# Patient Record
Sex: Male | Born: 1935 | Smoking: Never smoker
Health system: Southern US, Community
[De-identification: ages and names within clinical notes are randomized; demographics above are authoritative.]

## PROBLEM LIST (undated history)

## (undated) DIAGNOSIS — I10 Essential (primary) hypertension: Secondary | ICD-10-CM

## (undated) DIAGNOSIS — E119 Type 2 diabetes mellitus without complications: Secondary | ICD-10-CM

## (undated) DIAGNOSIS — F0390 Unspecified dementia without behavioral disturbance: Secondary | ICD-10-CM

## (undated) HISTORY — PX: AORTIC VALVE REPLACEMENT: SHX41

---

## 2004-08-10 ENCOUNTER — Encounter: Payer: Self-pay | Admitting: Family Medicine

## 2004-09-10 ENCOUNTER — Encounter: Payer: Self-pay | Admitting: Family Medicine

## 2004-10-10 ENCOUNTER — Encounter: Payer: Self-pay | Admitting: Family Medicine

## 2004-11-10 ENCOUNTER — Encounter: Payer: Self-pay | Admitting: Family Medicine

## 2004-12-11 ENCOUNTER — Encounter: Payer: Self-pay | Admitting: Family Medicine

## 2005-01-08 ENCOUNTER — Encounter: Payer: Self-pay | Admitting: Family Medicine

## 2005-02-08 ENCOUNTER — Encounter: Payer: Self-pay | Admitting: Family Medicine

## 2005-03-10 ENCOUNTER — Encounter: Payer: Self-pay | Admitting: Family Medicine

## 2005-04-10 ENCOUNTER — Encounter: Payer: Self-pay | Admitting: Family Medicine

## 2005-05-10 ENCOUNTER — Encounter: Payer: Self-pay | Admitting: Family Medicine

## 2005-06-10 ENCOUNTER — Encounter: Payer: Self-pay | Admitting: Family Medicine

## 2005-07-11 ENCOUNTER — Encounter: Payer: Self-pay | Admitting: Family Medicine

## 2005-08-10 ENCOUNTER — Encounter: Payer: Self-pay | Admitting: Family Medicine

## 2005-09-10 ENCOUNTER — Encounter: Payer: Self-pay | Admitting: Family Medicine

## 2005-10-10 ENCOUNTER — Encounter: Payer: Self-pay | Admitting: Family Medicine

## 2005-10-23 ENCOUNTER — Ambulatory Visit: Payer: Self-pay | Admitting: Family Medicine

## 2005-11-10 ENCOUNTER — Ambulatory Visit: Payer: Self-pay | Admitting: Family Medicine

## 2005-11-12 ENCOUNTER — Encounter: Payer: Self-pay | Admitting: Family Medicine

## 2005-12-11 ENCOUNTER — Encounter: Payer: Self-pay | Admitting: Family Medicine

## 2005-12-16 ENCOUNTER — Ambulatory Visit: Payer: Self-pay | Admitting: Family Medicine

## 2006-01-01 ENCOUNTER — Ambulatory Visit: Payer: Self-pay | Admitting: Ophthalmology

## 2006-01-06 ENCOUNTER — Ambulatory Visit: Payer: Self-pay | Admitting: Ophthalmology

## 2006-01-08 ENCOUNTER — Encounter: Payer: Self-pay | Admitting: Family Medicine

## 2006-01-08 ENCOUNTER — Ambulatory Visit: Payer: Self-pay | Admitting: Family Medicine

## 2006-02-08 ENCOUNTER — Encounter: Payer: Self-pay | Admitting: Family Medicine

## 2006-03-10 ENCOUNTER — Encounter: Payer: Self-pay | Admitting: Family Medicine

## 2006-04-10 ENCOUNTER — Encounter: Payer: Self-pay | Admitting: Family Medicine

## 2006-05-10 ENCOUNTER — Encounter: Payer: Self-pay | Admitting: Family Medicine

## 2006-06-10 ENCOUNTER — Encounter: Payer: Self-pay | Admitting: Family Medicine

## 2006-07-11 ENCOUNTER — Encounter: Payer: Self-pay | Admitting: Family Medicine

## 2006-08-10 ENCOUNTER — Encounter: Payer: Self-pay | Admitting: Family Medicine

## 2006-09-10 ENCOUNTER — Encounter: Payer: Self-pay | Admitting: Family Medicine

## 2006-10-10 ENCOUNTER — Encounter: Payer: Self-pay | Admitting: Family Medicine

## 2006-11-10 ENCOUNTER — Encounter: Payer: Self-pay | Admitting: Family Medicine

## 2006-12-11 ENCOUNTER — Encounter: Payer: Self-pay | Admitting: Family Medicine

## 2007-01-09 ENCOUNTER — Encounter: Payer: Self-pay | Admitting: Family Medicine

## 2007-02-09 ENCOUNTER — Encounter: Payer: Self-pay | Admitting: Family Medicine

## 2007-03-11 ENCOUNTER — Encounter: Payer: Self-pay | Admitting: Family Medicine

## 2007-04-11 ENCOUNTER — Encounter: Payer: Self-pay | Admitting: Family Medicine

## 2007-05-11 ENCOUNTER — Encounter: Payer: Self-pay | Admitting: Family Medicine

## 2009-06-19 ENCOUNTER — Emergency Department: Payer: Self-pay | Admitting: Emergency Medicine

## 2009-09-10 ENCOUNTER — Ambulatory Visit: Payer: Self-pay | Admitting: Family Medicine

## 2009-10-10 ENCOUNTER — Ambulatory Visit: Payer: Self-pay | Admitting: Family Medicine

## 2009-10-15 ENCOUNTER — Ambulatory Visit: Payer: Self-pay | Admitting: Family Medicine

## 2011-03-19 ENCOUNTER — Ambulatory Visit: Payer: Self-pay | Admitting: Cardiovascular Disease

## 2011-03-25 ENCOUNTER — Ambulatory Visit: Payer: Self-pay | Admitting: Internal Medicine

## 2011-12-12 ENCOUNTER — Ambulatory Visit: Payer: Self-pay | Admitting: Ophthalmology

## 2011-12-12 DIAGNOSIS — I10 Essential (primary) hypertension: Secondary | ICD-10-CM

## 2011-12-12 LAB — PROTIME-INR: INR: 3

## 2011-12-24 ENCOUNTER — Ambulatory Visit: Payer: Self-pay | Admitting: Ophthalmology

## 2012-08-16 ENCOUNTER — Ambulatory Visit: Payer: Self-pay | Admitting: Family Medicine

## 2013-04-08 ENCOUNTER — Ambulatory Visit: Payer: Self-pay | Admitting: Family Medicine

## 2014-01-12 ENCOUNTER — Ambulatory Visit: Payer: Self-pay | Admitting: Family Medicine

## 2014-02-08 ENCOUNTER — Ambulatory Visit: Payer: Self-pay | Admitting: Family Medicine

## 2014-02-17 ENCOUNTER — Inpatient Hospital Stay: Payer: Self-pay | Admitting: Family Medicine

## 2014-02-17 LAB — PROTIME-INR
INR: 2
Prothrombin Time: 22.3 secs — ABNORMAL HIGH (ref 11.5–14.7)

## 2014-02-17 LAB — COMPREHENSIVE METABOLIC PANEL
ALBUMIN: 3.7 g/dL (ref 3.4–5.0)
ALT: 29 U/L (ref 12–78)
Alkaline Phosphatase: 60 U/L
Anion Gap: 0 — ABNORMAL LOW (ref 7–16)
BUN: 19 mg/dL — ABNORMAL HIGH (ref 7–18)
Bilirubin,Total: 0.6 mg/dL (ref 0.2–1.0)
Calcium, Total: 8.7 mg/dL (ref 8.5–10.1)
Chloride: 108 mmol/L — ABNORMAL HIGH (ref 98–107)
Co2: 33 mmol/L — ABNORMAL HIGH (ref 21–32)
Creatinine: 0.92 mg/dL (ref 0.60–1.30)
EGFR (African American): >60
EGFR (Non-African Amer.): >60
Glucose: 101 mg/dL — ABNORMAL HIGH (ref 65–99)
OSMOLALITY: 284 (ref 275–301)
Potassium: 3.9 mmol/L (ref 3.5–5.1)
SGOT(AST): 29 U/L (ref 15–37)
Sodium: 141 mmol/L (ref 136–145)
Total Protein: 6.8 g/dL (ref 6.4–8.2)

## 2014-02-17 LAB — CBC
HCT: 48.1 % (ref 40.0–52.0)
HGB: 15.4 g/dL (ref 13.0–18.0)
MCH: 29.8 pg (ref 26.0–34.0)
MCHC: 32.1 g/dL (ref 32.0–36.0)
MCV: 93 fL (ref 80–100)
Platelet: 133 10*3/uL — ABNORMAL LOW (ref 150–440)
RBC: 5.17 10*6/uL (ref 4.40–5.90)
RDW: 13.4 % (ref 11.5–14.5)
WBC: 9.7 10*3/uL (ref 3.8–10.6)

## 2014-02-17 LAB — TSH: THYROID STIMULATING HORM: 1.64 u[IU]/mL

## 2014-02-17 LAB — MAGNESIUM: Magnesium: 1.6 mg/dL — ABNORMAL LOW

## 2014-02-17 LAB — TROPONIN I
Troponin-I: 0.03 ng/mL
Troponin-I: 0.02 ng/mL

## 2014-02-17 LAB — APTT: ACTIVATED PTT: 33.8 s (ref 23.6–35.9)

## 2014-02-18 LAB — CBC WITH DIFFERENTIAL/PLATELET
Basophil #: 0 10*3/uL (ref 0.0–0.1)
Basophil %: 0.3 %
Eosinophil #: 0.1 10*3/uL (ref 0.0–0.7)
Eosinophil %: 1.7 %
HCT: 41.4 % (ref 40.0–52.0)
HGB: 13.2 g/dL (ref 13.0–18.0)
Lymphocyte #: 1.7 10*3/uL (ref 1.0–3.6)
Lymphocyte %: 19.5 %
MCH: 29.4 pg (ref 26.0–34.0)
MCHC: 31.9 g/dL — ABNORMAL LOW (ref 32.0–36.0)
MCV: 92 fL (ref 80–100)
MONO ABS: 0.7 x10 3/mm (ref 0.2–1.0)
Monocyte %: 8.8 %
NEUTROS ABS: 6 10*3/uL (ref 1.4–6.5)
Neutrophil %: 69.7 %
PLATELETS: 107 10*3/uL — AB (ref 150–440)
RBC: 4.5 10*6/uL (ref 4.40–5.90)
RDW: 13.5 % (ref 11.5–14.5)
WBC: 8.5 10*3/uL (ref 3.8–10.6)

## 2014-02-18 LAB — BASIC METABOLIC PANEL
ANION GAP: 2 — AB (ref 7–16)
BUN: 17 mg/dL (ref 7–18)
CALCIUM: 8.1 mg/dL — AB (ref 8.5–10.1)
CHLORIDE: 105 mmol/L (ref 98–107)
CREATININE: 0.86 mg/dL (ref 0.60–1.30)
Co2: 33 mmol/L — ABNORMAL HIGH (ref 21–32)
EGFR (African American): >60
EGFR (Non-African Amer.): >60
Glucose: 93 mg/dL (ref 65–99)
OSMOLALITY: 281 (ref 275–301)
POTASSIUM: 4 mmol/L (ref 3.5–5.1)
Sodium: 140 mmol/L (ref 136–145)

## 2014-02-18 LAB — LIPID PANEL
CHOLESTEROL: 108 mg/dL (ref 0–200)
HDL Cholesterol: 35 mg/dL — ABNORMAL LOW (ref 40–60)
Ldl Cholesterol, Calc: 60 mg/dL (ref 0–100)
TRIGLYCERIDES: 64 mg/dL (ref 0–200)
VLDL Cholesterol, Calc: 13 mg/dL (ref 5–40)

## 2014-02-18 LAB — MAGNESIUM: MAGNESIUM: 1.9 mg/dL

## 2014-02-18 LAB — PROTIME-INR
INR: 2.4
PROTHROMBIN TIME: 25.8 s — AB (ref 11.5–14.7)

## 2014-02-18 LAB — TROPONIN I: TROPONIN-I: 0.03 ng/mL

## 2014-02-19 LAB — PROTIME-INR
INR: 2.1
Prothrombin Time: 23.1 secs — ABNORMAL HIGH (ref 11.5–14.7)

## 2014-02-19 LAB — BASIC METABOLIC PANEL
Anion Gap: 3 — ABNORMAL LOW (ref 7–16)
BUN: 14 mg/dL (ref 7–18)
CHLORIDE: 101 mmol/L (ref 98–107)
CO2: 31 mmol/L (ref 21–32)
Calcium, Total: 8.6 mg/dL (ref 8.5–10.1)
Creatinine: 0.82 mg/dL (ref 0.60–1.30)
EGFR (African American): >60
EGFR (Non-African Amer.): >60
Glucose: 120 mg/dL — ABNORMAL HIGH (ref 65–99)
Osmolality: 272 (ref 275–301)
POTASSIUM: 4.1 mmol/L (ref 3.5–5.1)
Sodium: 135 mmol/L — ABNORMAL LOW (ref 136–145)

## 2014-02-19 LAB — PLATELET COUNT: Platelet: 127 10*3/uL — ABNORMAL LOW (ref 150–440)

## 2014-03-10 ENCOUNTER — Ambulatory Visit: Payer: Self-pay | Admitting: Family Medicine

## 2014-04-26 DIAGNOSIS — I4891 Unspecified atrial fibrillation: Secondary | ICD-10-CM | POA: Insufficient documentation

## 2014-04-26 DIAGNOSIS — R270 Ataxia, unspecified: Secondary | ICD-10-CM | POA: Insufficient documentation

## 2014-04-26 DIAGNOSIS — E538 Deficiency of other specified B group vitamins: Secondary | ICD-10-CM | POA: Insufficient documentation

## 2014-04-26 DIAGNOSIS — R200 Anesthesia of skin: Secondary | ICD-10-CM | POA: Insufficient documentation

## 2014-07-27 DIAGNOSIS — E119 Type 2 diabetes mellitus without complications: Secondary | ICD-10-CM | POA: Insufficient documentation

## 2014-10-07 IMAGING — CR DG CHEST 2V
1 series · 2 of 2 positions shown · non-contrast
Comparison: none

REASON FOR EXAM: cough wheezing Atelectasis
COMMENTS:

PROCEDURE:     KDR - KDXR CHEST PA (OR AP) AND LAT  - April 08, 2013 [DATE]
RESULT:     Comparison: None.

[Series 1: pa · 0.17mm/px · 2 of 2 slices shown]
[im 1/2]
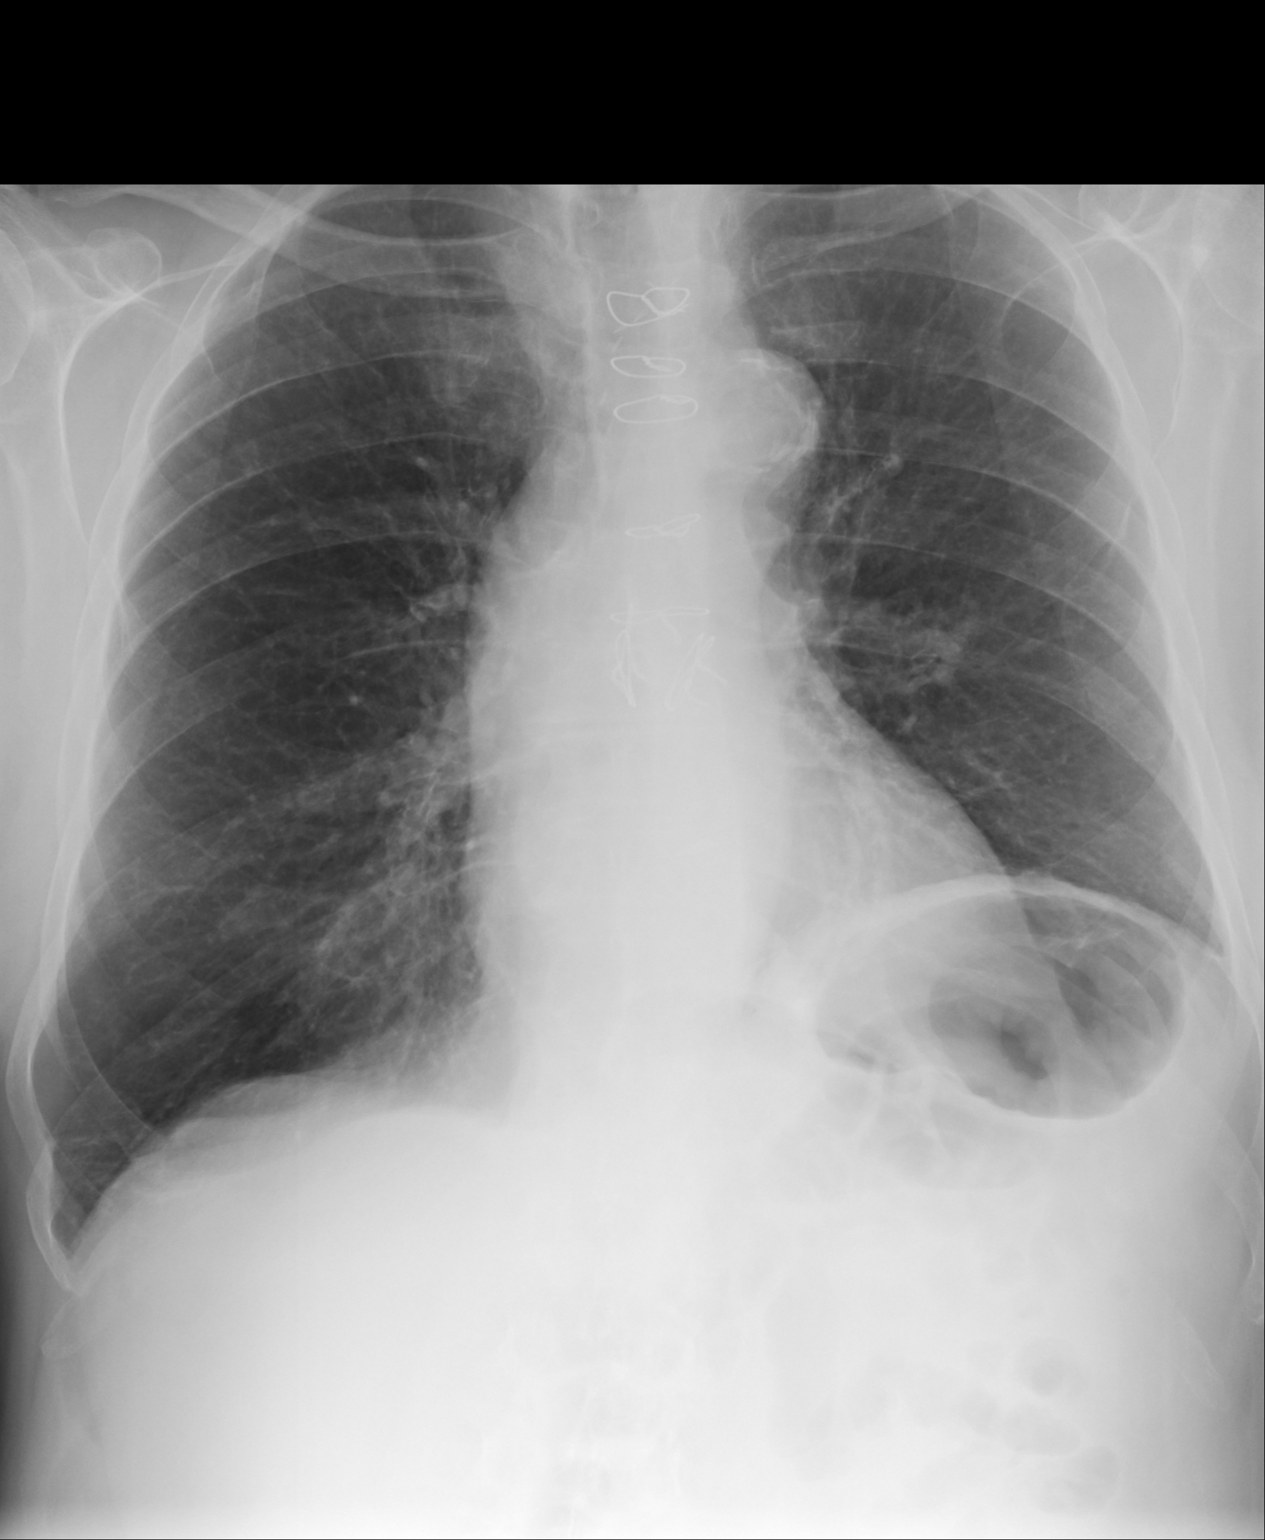
[im 2/2]
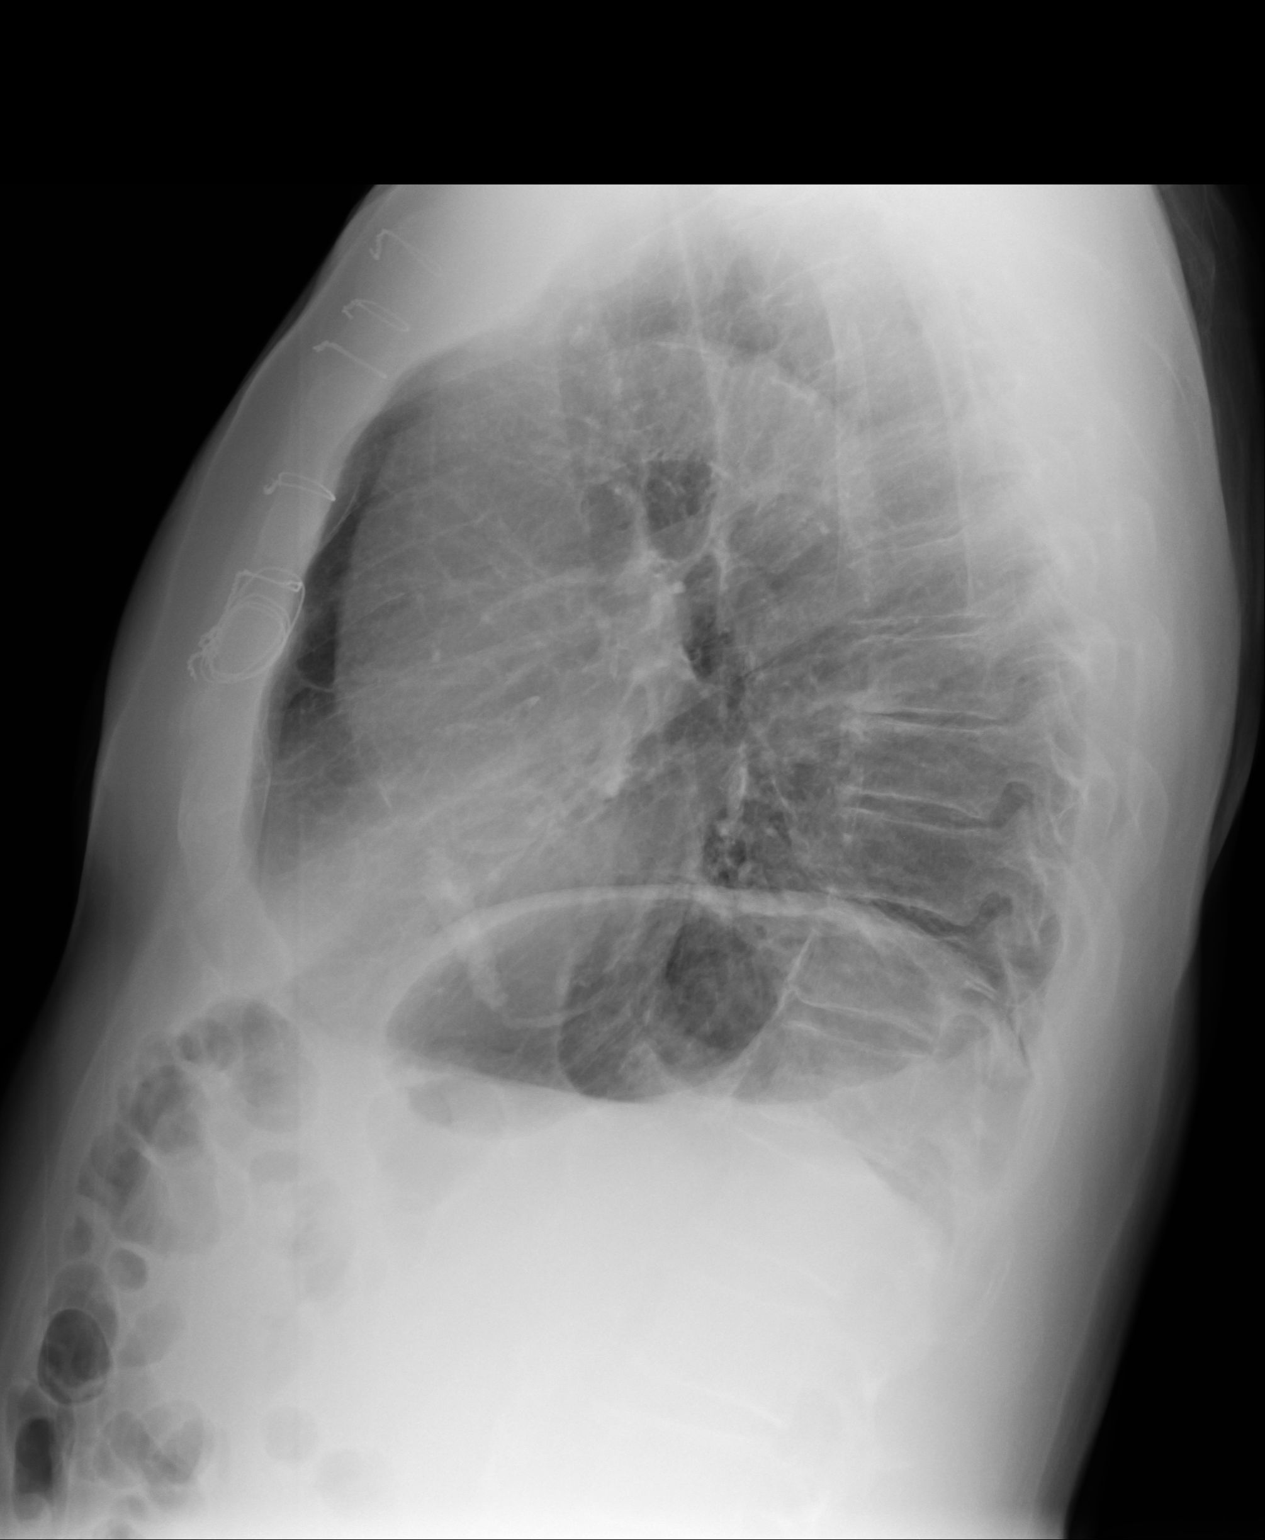

[2 of 2 positions shown; findings below may reference images not displayed]

FINDINGS: The heart is normal in size. Aorta is not tortuous. There is mild elevation
of the left hemidiaphragm. The right lung is somewhat hyperinflated.  Mild
left basilar opacities are likely secondary to atelectasis.
IMPRESSION: Mild left basilar opacities are likely secondary to atelectasis from the
left hemidiaphragm elevation. Followup PA and lateral chest radiograph is
suggested.

[REDACTED]

## 2015-03-03 NOTE — Consult Note (Signed)
PATIENT NAME:  Russell Fernandez, Russell Fernandez MR#:  [id number] DATE OF BIRTH:  [birth year]  DATE OF CONSULTATION:    REFERRING PHYSICIAN:  Dr. Renae GlossWieting CONSULTING PHYSICIAN:  Lamar BlinksBruce J. Kowalski, MD  REASON FOR CONSULTATION:  Aortic valve stenosis with atrial fibrillation with rapid ventricular rate and evidence of syncope.  The patient has had known aortic valve stenosis status post replacement many years ago and has had new onset of atrial fibrillation with rapid ventricular rate causing syncope.  At that time, the patient has had no evidence of significant chest discomfort, shortness of breath with or without physical activity.  The patient has had now seen in the Emergency Room with an EKG showing atrial fibrillation with rapid ventricular rate, but now controlled on diltiazem drip and much lower.  The patient has had no evidence of heart failure.    The remainder of review of systems negative for vision change, ringing in the ears, hearing loss, cough, congestion, heartburn, nausea, vomiting, diarrhea, bloody stools, stomach pain, extremity pain, leg weakness, cramping of the buttocks, known blood clots, headaches, dizzy spells or headaches, known blackouts as above.  No frequent urination, urination at night, muscle weakness, numbness, anxiety, depression, skin lesions, skin rashes.   PAST MEDICAL HISTORY: 1.  Aortic valve stenosis status post replacement.  2.  Hypertension.   FAMILY HISTORY:  No family members with early onset of cardiovascular disease or hypertension.   SOCIAL HISTORY:  Currently denies alcohol, tobacco use.   ALLERGIES:  AS LISTED.   MEDICATIONS:  As listed.   PHYSICAL EXAMINATION: VITAL SIGNS:  Blood pressure 110/68 bilaterally, heart rate 72 upright, reclining, and regular.  GENERAL:  He is a well-appearing male in no acute distress.  HEAD, EYES, EARS, NOSE, THROAT:  No icterus, thyromegaly, ulcers, hemorrhage, or xanthelasma.  CARDIOVASCULAR:  Irregularly irregular with normal S1 and  S2.  A 2 to 3 out of 6 right upper sternal border murmur nonradiating.  PMI is diffuse.  Carotid upstroke normal without bruit.  Jugular venous pressure is normal.  LUNGS:  Clear to auscultation with normal respirations.  ABDOMEN:  Soft, nontender, without hepatosplenomegaly or masses.  Abdominal aorta is normal in size without bruit.  EXTREMITIES:  2+ bilateral pulses in dorsal, pedal, radial and femoral arteries without lower extremity edema, cyanosis, clubbing or ulcers.  NEUROLOGIC:  He is oriented to time, place, and person, with normal mood and affect.   ASSESSMENT:  A 81 year old male with hypertension, hyperlipidemia and aortic valve replacement with atrial fibrillation and rapid ventricular rate causing syncope.   RECOMMENDATIONS: 1.  Continue serial echocardiogram and enzymes to assess for possible myocardial infarction.   2.  Echocardiogram for left ventricular dysfunction, change in valvular heart disease.  3.  Heart rate control with diltiazem drip and change to oral medication management.  4.  Anticoagulation for further risk reduction in stroke with atrial fibrillation.  5.  Further adjustments of medications after ambulation and possible further diagnostics depending on above.     ____________________________ Lamar BlinksBruce J. Kowalski, MD bjk:ea D: 02/17/2014 23:45:37 ET T: 02/18/2014 00:37:15 ET JOB#: [id number]  cc: Lamar BlinksBruce J. Kowalski, MD, <Dictator> Lamar BlinksBRUCE J KOWALSKI MD ELECTRONICALLY SIGNED 02/21/2014 7:50

## 2015-03-03 NOTE — Discharge Summary (Signed)
PATIENT NAME:  Russell Fernandez, Russell Fernandez MR#:  [id number] DATE OF BIRTH:  [birth year]  DATE OF ADMISSION:  02/17/2014 DATE OF DISCHARGE:  02/19/2014  REASON FOR ADMISSION: Rapid or fast heart rate.   DISCHARGE DIAGNOSES: 1. Atrial fibrillation with a rapid ventricular response.  2. Mechanical heart valve.  3. Chronic anticoagulation.  4. Hypertension.  5. Hyperlipidemia.  6. Type II controlled diabetes.  7. Hypokalemia.  8. Consumptive thrombocytopenia.  9. Increased CO2 with some mild possible respiratory alkalosis. Consider diagnosis of sleep apnea. Follow up outpatient.  10. Hypomagnesemia.  REPORTED RESULTS: Glucose 101, BUN 19, CO2 is slightly elevated at 33, magnesium was 1.6. LFTS were normal. Thyroid stimulating hormone 1.624, hemoglobin was 15.4. White count is 9.7, platelet count started at 133 dropped to 107 and back up to 127. INR at discharge 2.1.   Echocardiogram ejection fraction of 55% to 60%, normal ventricular systolic function, aortic valve normally functioning with mechanical prostatic valve [employer].   HOSPITAL COURSE: This is a very nice 81 year old gentleman with history of aortic valve replacement and anticoagulation, diabetes, hyperlipidemia, hypertension and some mild dementia. The patient comes on 02/17/2014 with a chief complaint of having rapid fast rate. The patient was taking his blood pressure with a new electronic monitor and noticed that his heart rate was in the 140s. He was mostly asymptomatic. He checked is pulse  several times and did notice heart rate was in the 140s, had been feeling tired lately and whenever he found out that his heart rate was high, started  to get nervous and really short of breath. The patient was seen in the Emergency Department where the wife actually told the ER  physician the evening before the patient had an episode where he passed out while sitting on the desk and rapidly recovered consciousness and did not want to go to the Emergency  Department. Department. The patient was given a Cardizem drip and evaluated for admission. At admission his heart rate was up to 139. He was sent to the stepdown unit and then he was downgraded to telemetry after he was able to get out of the Cardizem drip quickly. He was started on metoprolol 25 mg twice daily.   AS PER PROBLEMS:  1. Atrial flutter, atrial fibrillation with rapid ventricular response. The patient was seen by cardiology. Dr. Juliann Paresallwood is his primary cardiologist, Dr. Gwen PoundsKowalski was covering for him. The patient was continued on new beta blocker. The first day the patient was having great control with a heart rate in the 50s. He was feeling dizzy and lightheaded and weak for which the dose was changed to half of the dose 12.5 mg twice daily. By the following day, the patient was feeling much better and his heart rate was now in the 60s with a good blood pressure. No more symptoms of dizziness and lightheadedness.  2. As far as his mechanical valve replacement. The patient had Coumadin 5 mg and usually has been very stable for years. At this moment, we are going to continue him on the same dose and send him home. He had an extra dose of 2.5 mg the day of admission. At discharge, his INR is 2.1, but the patient feels comfortable taking his normal dose. This will be okay since he had a lot of trouble getting to a stable dose.  He has been told that to follow up with his Coumadin clinic in the next couple of days and if his INR is still below 2.5, which  is his goal due to his mechanical valve replacement to get an increase of the dose of Coumadin.  3. As far as his diabetes, was controlled with Glucophage.  4. Hyperlipidemia. The patient was asked to continue Lipitor.  5. Hypertension, acceptable control.   The patient had an echocardiogram that did not show any major abnormalities. The valve was working very good. The patient was given recommendation as far as avoiding strenuous physical activity,  avoiding a lot of caffeinated drinks and to go to the Emergency Department if the problem persists.   The patient was given at discharge, Coumadin 5 mg daily, enalapril 20 mg twice daily, atorvastatin 40 mg daily, fish oil 1 capsule once daily, vitamin B12 once daily, metformin 500 mg once daily, donepezil 5 mg once daily, metoprolol succinate 12.5 mg twice daily.   Follow-up with Dr. Juliann Pares and Dr. Dennison Mascot.   I spent about 40 minutes discharging this patient.   ____________________________ Felipa Furnace, MD rsg:sg D: 02/20/2014 07:19:03 ET T: 02/20/2014 08:20:35 ET JOB#: [id number]  cc: Felipa Furnace, MD, <Dictator> Dennison Mascot, MD Dwayne D. Juliann Pares, MD   Regan Rakers Juanda Chance MD ELECTRONICALLY SIGNED 02/26/2014 5:14

## 2015-03-03 NOTE — H&P (Signed)
PATIENT NAME:  Russell Fernandez, COTHERN MR#:  [id number] DATE OF BIRTH:  May 18, 1933  DATE OF ADMISSION:  02/17/2014  PRIMARY CARE PHYSICIAN: Dennison Mascot, MD  CARDIOLOGIST: Gerda Diss D. Callwood, MD  CHIEF COMPLAINT: Fast heart rate.   HISTORY OF PRESENT ILLNESS: This is a 81 year old man who has a history of mechanical valve replacement 17 years ago. He was routinely taking his blood pressure today, which was normal, but he saw his heart rate has been up in the 140s. He took his blood pressure numerous times, and the heart rate was up in the 140s each time. He states that he had some slight shortness of breath, he felt tired, and then he got scared and came in for further evaluation. He has been battling some bad allergies recently. His wife stated last evening that either he fell asleep or passed out quickly while he was at the keyboard. His head hit the keyboard, but then woke up right afterwards and then continued to do what he was doing on the computer. In the ER, initial EKG was rapid atrial flutter and then looks more like atrial fibrillation now on the monitor. The patient was placed on a Cardizem drip, and hospitalist services were contacted for further evaluation.   PAST MEDICAL HISTORY: Aortic valve replacement, allergies, diabetes, hyperlipidemia, hypertension, thumb arthritis, mild memory loss.   PAST SURGICAL HISTORY: Mechanical heart valve replacement (He thinks it is his aortic valve), cataract surgery.   ALLERGIES: No known drug allergies.   MEDICATIONS: Include atorvastatin 40 mg daily, Coumadin 5 mg daily, benazepril 5 mg at bedtime, enalapril 20 mg twice a day, fish oil 1200 mg daily, metformin 500 mg daily, vitamin B12 at 250 mcg daily.   SOCIAL HISTORY: No smoking. No alcohol. No drug use. 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 Lives with his wife.   FAMILY HISTORY: Father died at 54 of old age. He did have memory loss. Mother died in her 7s of  Alzheimer's. Brother died of heart disease. A sister brought died of bone cancer; she also had a heart valve replacement and heart disease.   REVIEW OF SYSTEMS:    CONSTITUTIONAL: No fever, chills or sweats. Positive for weight loss. Slight tiredness.  EARS, NOSE, MOUTH, AND THROAT: Positive for runny nose. Positive for postnasal drip. No sore throat. No difficulty swallowing.  EYES: No blurry vision.  CARDIOVASCULAR: No chest pain. No palpitations.  RESPIRATORY: Positive for slight shortness of breath, slight cough with the postnasal drip. No hemoptysis.  GASTROINTESTINAL: Positive for loose bowel movements. No nausea or vomiting. No abdominal pain. No bright red blood per rectum. No melena.  GENITOURINARY: No burning on urination. No hematuria.  MUSCULOSKELETAL: Positive for right thumb arthritis.  INTEGUMENTARY: No rashes or eruptions.  NEUROLOGIC: Questionable syncopal episode last night.  PSYCHIATRIC: No anxiety or depression.  ENDOCRINE: No thyroid problems.  HEMATOLOGIC AND LYMPHATIC: No anemia.   PHYSICAL EXAMINATION: VITAL SIGNS: On presentation to the Emergency Room included a temperature of 97.7, pulse 139, respirations 20, blood pressure 117/77, pulse oximetry 99% on room air.  GENERAL: No respiratory distress.  EYES: Conjunctivae and lids normal. Pupils equal, round, and reactive to light. Extraocular muscles intact. No nystagmus.  EARS, NOSE, MOUTH, AND THROAT: Tympanic membranes: No erythema. Nasal mucosa: No erythema. Throat: No erythema, no exudate seen. Lips and gums: No lesions.  NECK: No JVD. No bruits. No lymphadenopathy. No thyromegaly. No thyroid nodules palpated.  LUNGS: Clear to  auscultation. No use of accessory muscles to breathe. No rhonchi, rales, or wheeze heard.  CARDIOVASCULAR: S1, S2, irregularly, irregular, tachycardic; 2/6 systolic ejection murmur; no gallops or rubs heard. Carotid upstroke 2+ bilaterally. No bruits. Dorsalis pedis pulses 1+ bilaterally.  Trace edema of the lower extremity.  ABDOMEN: Soft, nontender. No organomegaly/splenomegaly. Normoactive bowel sounds. No masses felt.  LYMPHATIC: No lymph nodes in the neck.  MUSCULOSKELETAL: Trace edema. No clubbing. No cyanosis.  SKIN: No ulcers or lesions.  NEUROLOGIC: Cranial nerves II through XII grossly intact. Deep tendon reflexes 1+ bilateral lower extremities.  PSYCHIATRIC: The patient is oriented to person, place, and time.   LABORATORY AND DIAGNOSTIC DATA: Chest x-ray showed chronic elevation of left hemidiaphragm. Troponin negative. INR 2. White blood cell count 9.7, hemoglobin and hematocrit 15.4 and 48.1, platelet count of 133. Glucose 109, BUN 19, creatinine 0.92, sodium 141, potassium 3.9, chloride 108, CO2 of 33, calcium 8.7. Liver function tests: Normal range.   EKG: Atrial flutter, 2:1 block, 137 beats per minute.   ASSESSMENT AND PLAN: 1.  Atrial flutter, looks like atrial fibrillation now: The patient is on Cardizem drip. 10 mg per hour. Will be admitted to Critical Care Unit stepdown. In order to try to get the patient downgraded to telemetry, we will start, metoprolol 25 mg stat and 25 mg b.i.d. in order to taper off the Cardizem. Will get an echocardiogram. Case discussed with Dr. Gwen PoundsKowalski, covering for Dr. Juliann Paresallwood, to see the patient. I will check a TSH and a magnesium.  2.  Mechanical heart valve: INR 2. Goal a little bit higher. Will give an extra dose of Coumadin 2.5 mg stat and check a PT in the a.m.  3.  Diabetes: On Glucophage.  4.  Hyperlipidemia: On Lipitor.  5.  Hypertension: On enalapril.  6.  Mild dementia: On Aricept.   TIME SPENT ON ADMISSION: 55 minutes.   CODE STATUS: The patient is a full code.   Right now, the patient will be admitted to the CCU secondary to rapid heart rate.   ____________________________ Herschell Dimesichard J. Renae GlossWieting, MD rjw:jcm D: 02/17/2014 13:47:52 ET T: 02/17/2014 15:03:34 ET JOB#: [id number]  cc: Herschell Dimesichard J. Renae GlossWieting, MD,  <Dictator> Dennison MascotLemont Morrisey, MD Dwayne D. Juliann Paresallwood, MD  Salley ScarletICHARD J Khaled Herda MD ELECTRONICALLY SIGNED 02/19/2014 19:18

## 2015-03-04 NOTE — Op Note (Signed)
PATIENT NAME:  Russell Fernandez, Russell Fernandez MR#:  [id number] DATE OF BIRTH:  [birth year]  DATE OF PROCEDURE:  12/24/2011  PREOPERATIVE DIAGNOSIS:  Senile cataract left eye.  POSTOPERATIVE DIAGNOSIS:  Senile cataract left eye.  PROCEDURE:  Phacoemulsification with posterior chamber intraocular lens implantation of the left eye.  LENS:  ZCB00 18.0-diopter posterior chamber intraocular lens.  ULTRASOUND TIME:  11% of 1 minute, 3 seconds. CDE 7.3.  SURGEON:  Italyhad Eudora Guevarra, MD  ANESTHESIA:  Topical with tetracaine drops and 2% Xylocaine jelly.  COMPLICATIONS:  None.  DESCRIPTION OF PROCEDURE:  The patient was identified in the holding room and transported to the operating room and placed in the supine position under the operating microscope.  The left eye was identified as the operative eye and it was prepped and draped in the usual sterile ophthalmic fashion.  A 1 millimeter clear-corneal paracentesis was made at the 1:30 position.  The anterior chamber was filled with Viscoat viscoelastic.  A 2.4 millimeter keratome was used to make a near-clear corneal incision at the 10:30 position.  A curvilinear capsulorrhexis was made with a cystotome and capsulorrhexis forceps.  Balanced salt solution was used to hydrodissect and hydrodelineate the nucleus.  Phacoemulsification was then used in horizontal chopping fashion to remove the lens nucleus and epinucleus.  The remaining cortex was then removed using the irrigation and aspiration handpiece. Provisc was then placed into the capsular bag to distend it for lens placement.  An ZCB00 18.0-diopter lens was then injected into the capsular bag.  The remaining viscoelastic was aspirated.  Wounds were hydrated with balanced salt solution.  The anterior chamber was inflated to a physiologic pressure with balanced salt solution.  Miostat was placed into the anterior chamber to constrict the pupil.  No wound leaks were noted.  Topical Vigamox drops and Maxitrol ointment were  applied to the eye.  The patient was taken to the recovery room in stable condition without complications of anesthesia or surgery.  ____________________________ Deirdre Evenerhadwick R. Cybill Uriegas, MD crb:cms D: 12/24/2011 14:58:39 ET T: 12/24/2011 17:13:16 ET JOB#: [id number]  cc: Deirdre Evenerhadwick R. Collier Monica, MD, <Dictator> Lockie MolaHADWICK Emmaly Leech MD ELECTRONICALLY SIGNED 12/31/2011 13:08

## 2015-04-12 ENCOUNTER — Telehealth: Payer: Self-pay | Admitting: Emergency Medicine

## 2015-04-12 NOTE — Telephone Encounter (Signed)
Russell GarnetMelissa Kallen called stating Russell Fernandez is there with her in [place] and do not have any medication. Due to Russell ForestShirley being in hospital he is more confused and not sure what he is taking. [facility] on Cou[facility] and is completely out. Not sure of any other medications. Spoke to [name] and he oke'd for refills to be called in to pharmacy for Russell Fernandez. Scripts called in.

## 2015-04-18 ENCOUNTER — Telehealth: Payer: Self-pay | Admitting: Family Medicine

## 2015-04-18 NOTE — Telephone Encounter (Signed)
Called [facility] and notified her that [place] is in [place] and will be for an undetermined amount of time. Please keep referral open.

## 2015-04-18 NOTE — Telephone Encounter (Signed)
Please return Naalehuarrie call. She would like to discuss a home health order.

## 2015-04-26 ENCOUNTER — Ambulatory Visit (INDEPENDENT_AMBULATORY_CARE_PROVIDER_SITE_OTHER): Payer: Medicare Other | Admitting: Emergency Medicine

## 2015-04-26 DIAGNOSIS — I82409 Acute embolism and thrombosis of unspecified deep veins of unspecified lower extremity: Secondary | ICD-10-CM

## 2015-04-26 LAB — POCT INR: INR: 1.8

## 2015-05-03 ENCOUNTER — Ambulatory Visit (INDEPENDENT_AMBULATORY_CARE_PROVIDER_SITE_OTHER): Payer: Medicare Other | Admitting: Family Medicine

## 2015-05-03 DIAGNOSIS — Z952 Presence of prosthetic heart valve: Secondary | ICD-10-CM | POA: Insufficient documentation

## 2015-05-03 DIAGNOSIS — Z954 Presence of other heart-valve replacement: Secondary | ICD-10-CM

## 2015-05-03 LAB — POCT INR
INR: 1.4
PT: 17.3

## 2015-05-23 ENCOUNTER — Telehealth: Payer: Self-pay | Admitting: Family Medicine

## 2015-05-23 ENCOUNTER — Ambulatory Visit (INDEPENDENT_AMBULATORY_CARE_PROVIDER_SITE_OTHER): Payer: Medicare Other | Admitting: Family Medicine

## 2015-05-23 DIAGNOSIS — Z952 Presence of prosthetic heart valve: Secondary | ICD-10-CM

## 2015-05-23 DIAGNOSIS — Z954 Presence of other heart-valve replacement: Secondary | ICD-10-CM

## 2015-05-23 DIAGNOSIS — F0390 Unspecified dementia without behavioral disturbance: Secondary | ICD-10-CM

## 2015-05-23 LAB — POCT INR: INR: 1.5

## 2015-05-23 NOTE — Telephone Encounter (Signed)
[facility] states that Dr Malvin JohnsPotter began patient on Depakote 125mg  2x daily. Would like to inform you because this medication could affect the patient coumodin levels.

## 2015-05-24 ENCOUNTER — Telehealth: Payer: Self-pay | Admitting: Family Medicine

## 2015-05-24 NOTE — Telephone Encounter (Signed)
[facility] and stated she needs a call back about pt.

## 2015-05-24 NOTE — Patient Outreach (Signed)
[facility] Care Management  05/24/2015  Russell Fernandez 1933/09/06 [id number]   Consult from MD office, Wynonia Lawmanheryl Poteat, RN assigned to outreach.  Corrie MckusickLisa O. Sharlee BlewMoore, AABA [facility] Care Management [facility] CM Assistant Phone: [phone] Fax: [phone]

## 2015-05-24 NOTE — Telephone Encounter (Signed)
Called office and left message for Lowella Bandyikki to return my call,she was out seeing patients.

## 2015-05-24 NOTE — Progress Notes (Signed)
Name: Russell Fernandez   MRN: [id number]    DOB: 05-28-1933   Date:05/24/2015       Progress Note  Subjective  Chief Complaint  No chief complaint on file.   HPI No past medical history on file.  History  Substance Use Topics  . Smoking status: Not on file  . Smokeless tobacco: Not on file  . Alcohol Use: Not on file    No current outpatient prescriptions on file.  Not on File  ROS   Objective  There were no vitals filed for this visit.   Physical Exam    Assessment & Plan

## 2015-06-06 ENCOUNTER — Ambulatory Visit (INDEPENDENT_AMBULATORY_CARE_PROVIDER_SITE_OTHER): Payer: Medicare Other | Admitting: Family Medicine

## 2015-06-06 DIAGNOSIS — Z954 Presence of other heart-valve replacement: Secondary | ICD-10-CM | POA: Diagnosis not present

## 2015-06-06 DIAGNOSIS — Z952 Presence of prosthetic heart valve: Secondary | ICD-10-CM

## 2015-06-06 LAB — POCT INR: INR: 1.2

## 2015-06-06 MED ORDER — WARFARIN SODIUM 5 MG PO TABS: 5.0000 mg | ORAL_TABLET | Freq: Every day | ORAL | Status: DC

## 2015-06-13 ENCOUNTER — Ambulatory Visit (INDEPENDENT_AMBULATORY_CARE_PROVIDER_SITE_OTHER): Payer: Medicare Other

## 2015-06-13 DIAGNOSIS — Z7901 Long term (current) use of anticoagulants: Secondary | ICD-10-CM | POA: Diagnosis not present

## 2015-06-13 DIAGNOSIS — Z5181 Encounter for therapeutic drug level monitoring: Secondary | ICD-10-CM | POA: Diagnosis not present

## 2015-06-13 DIAGNOSIS — I38 Endocarditis, valve unspecified: Secondary | ICD-10-CM

## 2015-06-13 LAB — POCT INR
INR: 1.3
INR: 1.3

## 2015-06-13 NOTE — Patient Instructions (Signed)
Dr. Thana Ates would like patient to take 7.5 mg Monday-Wednesday, Friday and Sunday and the other days Tuesday, Thursday and Saturday to take 10 mg. Then to return in 2 weeks for a recheck.

## 2015-06-22 ENCOUNTER — Other Ambulatory Visit: Payer: Self-pay | Admitting: Pharmacist

## 2015-06-22 NOTE — Patient Outreach (Signed)
Russell Fernandez is a 81 y.o. referred to pharmacy for assistance with medication adherence/management. Patient verifies name and date of birth. Speak to [name] about our program. Patient states that he does not have any need for our service and declines participation in the program.   Provide patient with my contact information in case he has medication questions in the future. Will now close out pharmacy episode for this patient and send Care Management Assistant Nena Polio an [facility] message that patient has declined to participate.  Duanne Moron, PharmD Clinical Pharmacist [facility] Care Management [phone]

## 2015-06-22 NOTE — Patient Outreach (Signed)
[facility]) Care Management  06/22/2015  GIO JANOSKI 04/10/1933 [id number]   Notification received from Duanne Moron, PharmD to close case due to patient refusal of services.  Vivian Neuwirth L. Lipa Knauff, AAS [facility] Care Management Assistant

## 2015-06-27 ENCOUNTER — Ambulatory Visit (INDEPENDENT_AMBULATORY_CARE_PROVIDER_SITE_OTHER): Payer: Medicare Other | Admitting: Emergency Medicine

## 2015-06-27 DIAGNOSIS — Z5181 Encounter for therapeutic drug level monitoring: Secondary | ICD-10-CM | POA: Diagnosis not present

## 2015-06-27 DIAGNOSIS — I38 Endocarditis, valve unspecified: Secondary | ICD-10-CM | POA: Diagnosis not present

## 2015-06-27 LAB — POCT INR: INR: 1.3

## 2015-07-11 ENCOUNTER — Ambulatory Visit: Payer: Federal, State, Local not specified - PPO

## 2015-07-17 ENCOUNTER — Encounter: Payer: Self-pay | Admitting: Family Medicine

## 2015-07-17 ENCOUNTER — Ambulatory Visit (INDEPENDENT_AMBULATORY_CARE_PROVIDER_SITE_OTHER): Payer: Medicare Other | Admitting: Family Medicine

## 2015-07-17 DIAGNOSIS — Z952 Presence of prosthetic heart valve: Secondary | ICD-10-CM

## 2015-07-17 DIAGNOSIS — I481 Persistent atrial fibrillation: Secondary | ICD-10-CM

## 2015-07-17 DIAGNOSIS — I10 Essential (primary) hypertension: Secondary | ICD-10-CM | POA: Insufficient documentation

## 2015-07-17 DIAGNOSIS — Z7901 Long term (current) use of anticoagulants: Secondary | ICD-10-CM

## 2015-07-17 DIAGNOSIS — F0390 Unspecified dementia without behavioral disturbance: Secondary | ICD-10-CM | POA: Insufficient documentation

## 2015-07-17 DIAGNOSIS — Z954 Presence of other heart-valve replacement: Secondary | ICD-10-CM | POA: Diagnosis not present

## 2015-07-17 DIAGNOSIS — I4819 Other persistent atrial fibrillation: Secondary | ICD-10-CM

## 2015-07-17 DIAGNOSIS — I509 Heart failure, unspecified: Secondary | ICD-10-CM | POA: Insufficient documentation

## 2015-07-17 DIAGNOSIS — E7800 Pure hypercholesterolemia, unspecified: Secondary | ICD-10-CM | POA: Insufficient documentation

## 2015-07-17 DIAGNOSIS — R011 Cardiac murmur, unspecified: Secondary | ICD-10-CM | POA: Insufficient documentation

## 2015-07-17 LAB — POCT INR: INR: 1.1

## 2015-07-17 NOTE — Patient Instructions (Signed)
No change in therapy. Patient is confused and will not allow Homecare to come in for assistance

## 2015-07-27 ENCOUNTER — Ambulatory Visit: Payer: Federal, State, Local not specified - PPO

## 2015-08-18 IMAGING — CR DG CHEST 1V PORT
1 series · 1 of 1 positions shown · non-contrast
Comparison: DG CHEST 2V dated 04/08/2013

CLINICAL DATA: tachycardia, hx DM, hx heart valve replacement,
former smoker

EXAM:
PORTABLE CHEST - 1 VIEW

[ap]
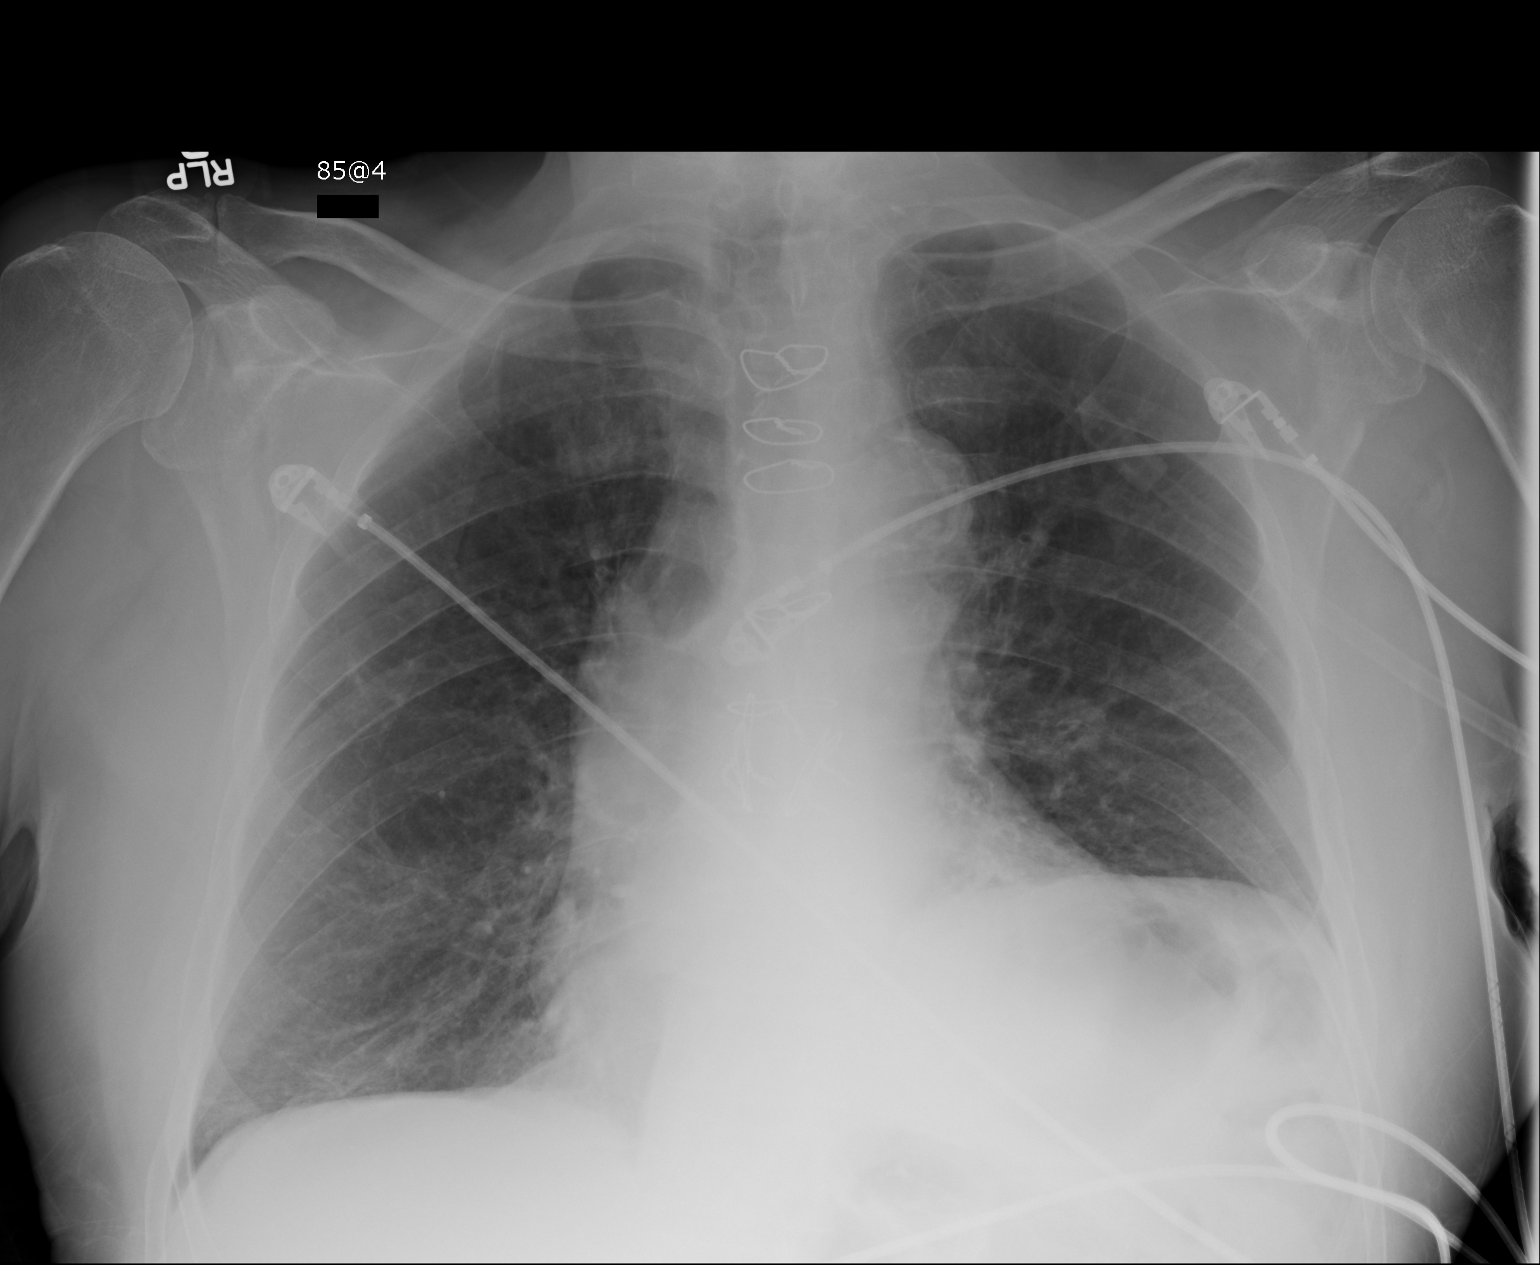

[1 of 1 positions shown; findings below may reference images not displayed]

FINDINGS: Chronic elevation of left hemidiaphragm. Cardiac silhouette
moderately enlarged. Patient is status post median sternotomy. The
aorta is tortuous. Atherosclerotic calcifications within arch of
aorta. Lungs are clear. Mild degenerative changes within the
shoulders.
IMPRESSION: Chronic elevation left hemidiaphragm no acute cardiopulmonary
disease.

## 2015-09-10 ENCOUNTER — Ambulatory Visit (INDEPENDENT_AMBULATORY_CARE_PROVIDER_SITE_OTHER): Payer: Medicare Other | Admitting: Family Medicine

## 2015-09-10 VITALS — BP 132/84 | HR 139 | Temp 97.7°F | Resp 16 | Ht 64.0 in | Wt 152.3 lb

## 2015-09-10 DIAGNOSIS — I481 Persistent atrial fibrillation: Secondary | ICD-10-CM | POA: Diagnosis not present

## 2015-09-10 DIAGNOSIS — F0390 Unspecified dementia without behavioral disturbance: Secondary | ICD-10-CM

## 2015-09-10 DIAGNOSIS — D692 Other nonthrombocytopenic purpura: Secondary | ICD-10-CM | POA: Diagnosis not present

## 2015-09-10 DIAGNOSIS — E538 Deficiency of other specified B group vitamins: Secondary | ICD-10-CM | POA: Diagnosis not present

## 2015-09-10 DIAGNOSIS — Z23 Encounter for immunization: Secondary | ICD-10-CM | POA: Diagnosis not present

## 2015-09-10 DIAGNOSIS — I4819 Other persistent atrial fibrillation: Secondary | ICD-10-CM

## 2015-09-10 LAB — POCT INR: INR: 1.2

## 2015-09-10 MED ORDER — DILTIAZEM HCL 30 MG PO TABS: 30.0000 mg | ORAL_TABLET | Freq: Three times a day (TID) | ORAL | Status: AC

## 2015-09-10 MED ORDER — CYANOCOBALAMIN 1000 MCG/ML IJ SOLN
1000.0000 ug | Freq: Once | INTRAMUSCULAR | Status: AC
  Administered 2015-09-10: 1000 ug via INTRAMUSCULAR

## 2015-09-10 NOTE — Progress Notes (Signed)
Name: Russell Fernandez   MRN: [id number]    DOB: February 18, 1933   Date:09/10/2015       Progress Note  Subjective  Chief Complaint  Chief Complaint  Patient presents with  . Atrial Fibrillation  . Dementia    HPI  Atrial Fibrillation/not rate controlled: history of aortic valve replacement. He has been given prescription of Coumadin, but not sure if taking. He has advanced dementia - wife died in May 15, 2023 - and lives by himself.  He was unable to give me the date, month or year. New that we were at [facility] in [facility]. He keeps saying that his wife is alive - initially said that his daughter was Byrd Hesselbach - but her name is Melissa.  Looks clean and has good eye contact. Seen by Neurologist , not sure if taking Aricept.    Patient Active Problem List   Diagnosis Date Noted  . CCF (congestive cardiac failure) (HCC) 07/17/2015  . Long term current use of anticoagulant 07/17/2015  . Dementia 07/17/2015  . Benign essential HTN 07/17/2015  . Hypercholesteremia 07/17/2015  . Cardiac murmur 07/17/2015  . H/O aortic valve replacement 07/17/2015  . Diabetes mellitus, type 2 (HCC) 07/27/2014  . 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 A-fib (HCC) 04/26/2014  . B12 deficiency 04/26/2014  . Absence of sensation 04/26/2014    History reviewed. No pertinent past surgical history.  History reviewed. No pertinent family history.  Social History   Social History  . Marital Status: Married    Spouse Name: N/A  . Number of Children: N/A  . Years of Education: N/A   Occupational History  . Not on file.   Social History Main Topics  . Smoking status: Never Smoker   . Smokeless tobacco: Never Used  . Alcohol Use: No  . Drug Use: No  . Sexual Activity: Not Currently   Other Topics Concern  . Not on file   Social History Narrative     Current outpatient prescriptions:  .  donepezil (ARICEPT) 10 MG tablet, Take 1 tablet by mouth., Disp: , Rfl:  .  diltiazem (CARDIZEM) 30 MG tablet, Take 1  tablet (30 mg total) by mouth 3 (three) times daily., Disp: 90 tablet, Rfl: 0  Allergies  Allergen Reactions  . Cetirizine Other (See Comments)    Causes patient to go in AFib.     ROS  Ten systems reviewed and is negative except as mentioned in HPI   Objective  Filed Vitals:   09/10/15 1527  BP: 132/84  Pulse: 139  Temp: 97.7 F (36.5 C)  TempSrc: Oral  Resp: 16  Height:  (1.626 m)  Weight: 152 lb 4.8 oz (69.083 kg)  SpO2: 96%    Body mass index is 26.13 kg/(m^2).  Physical Exam  Constitutional: Patient appears well-developed and thin.  No distress.  HEENT: head atraumatic, normocephalic, pupils equal and reactive to light, neck supple, throat within normal limits Cardiovascular:irregular rate and rhythm with high ventricular response.  No murmur heard. BLE edema. Pulmonary/Chest: Effort normal and breath sounds normal. No respiratory distress. Abdominal: Soft.  There is no tenderness. Psychiatric:Awake, alert disoriented in time, but not in place of self. Confused about his wife still being alive Skin: ecchymosis   Recent Results (from the past 2160 hour(s))  POCT INR     Status: None   Collection Time: 06/13/15  8:35 AM  Result Value Ref Range   INR 1.3   POCT INR     Status: Abnormal  Collection Time: 06/13/15  8:40 AM  Result Value Ref Range   INR 1.3   POCT INR     Status: None   Collection Time: 06/27/15  9:49 AM  Result Value Ref Range   INR 1.3   POCT INR     Status: None   Collection Time: 07/17/15 10:22 AM  Result Value Ref Range   INR 1.1   POCT INR     Status: None   Collection Time: 09/10/15  3:13 PM  Result Value Ref Range   INR 1.2       Assessment & Plan  1. Persistent atrial fibrillation (HCC)  Past INR's not therapeutic for months now, explained risk of stroke. He lives alone and has dementia. We will stop coumadin . Daughter is aware of the decision.  He had some palpitation recently, we will try Cardizem, discussed risk  of taking too many and also to monitor for hypotension, give first one when she is here with him - diltiazem (CARDIZEM) 30 MG tablet; Take 1 tablet (30 mg total) by mouth 3 (three) times daily.  Dispense: 90 tablet; Refill: 0   2. Dementia, without behavioral disturbance  Daughter , Efraim KaufmannMelissa live in [place], he has neighbors that take him out to eat.  She states that he has not been driving She did not check his pantry to see if he has any food in his house.  Discussed placement, either 24 hour home care, versus assistant living.  Discussed importance for her to get documentation to make decisions for him. Advised her to place his keys away  3. B12 deficiency  - cyanocobalamin ((VITAMIN B-12)) injection 1,000 mcg; Inject 1 mL (1,000 mcg total) into the muscle once.  4. Senile purpura (HCC)   5. Needs flu shot  - Flu vaccine HIGH DOSE PF (Fluzone High dose)

## 2015-09-25 ENCOUNTER — Telehealth: Payer: Self-pay | Admitting: Family Medicine

## 2015-09-25 NOTE — Telephone Encounter (Signed)
Pts daughter would like a call back when you can. She cancelled pts appt due to not being in town.

## 2015-10-01 NOTE — Telephone Encounter (Signed)
Pts daughter Efraim KaufmannMelissa called asking for a return call.

## 2015-10-02 ENCOUNTER — Ambulatory Visit: Payer: Federal, State, Local not specified - PPO | Admitting: Family Medicine

## 2015-10-02 NOTE — Telephone Encounter (Signed)
Called for appointment to discuss her fathers care. Need Guidance. Making appointment

## 2015-10-04 ENCOUNTER — Emergency Department: Payer: Medicare Other

## 2015-10-04 ENCOUNTER — Inpatient Hospital Stay
Admission: EM | Admit: 2015-10-04 | Discharge: 2015-10-11 | DRG: 308 | Disposition: E | Payer: Medicare Other | Attending: Internal Medicine | Admitting: Internal Medicine

## 2015-10-04 ENCOUNTER — Encounter: Payer: Self-pay | Admitting: Emergency Medicine

## 2015-10-04 DIAGNOSIS — I110 Hypertensive heart disease with heart failure: Secondary | ICD-10-CM | POA: Diagnosis present

## 2015-10-04 DIAGNOSIS — E119 Type 2 diabetes mellitus without complications: Secondary | ICD-10-CM | POA: Diagnosis present

## 2015-10-04 DIAGNOSIS — Y95 Nosocomial condition: Secondary | ICD-10-CM | POA: Diagnosis present

## 2015-10-04 DIAGNOSIS — R1310 Dysphagia, unspecified: Secondary | ICD-10-CM | POA: Diagnosis present

## 2015-10-04 DIAGNOSIS — Z66 Do not resuscitate: Secondary | ICD-10-CM | POA: Diagnosis present

## 2015-10-04 DIAGNOSIS — I472 Ventricular tachycardia: Secondary | ICD-10-CM | POA: Diagnosis present

## 2015-10-04 DIAGNOSIS — Z952 Presence of prosthetic heart valve: Secondary | ICD-10-CM

## 2015-10-04 DIAGNOSIS — I5022 Chronic systolic (congestive) heart failure: Secondary | ICD-10-CM | POA: Diagnosis present

## 2015-10-04 DIAGNOSIS — I482 Chronic atrial fibrillation: Secondary | ICD-10-CM | POA: Diagnosis present

## 2015-10-04 DIAGNOSIS — Z515 Encounter for palliative care: Secondary | ICD-10-CM | POA: Diagnosis not present

## 2015-10-04 DIAGNOSIS — J189 Pneumonia, unspecified organism: Secondary | ICD-10-CM | POA: Diagnosis present

## 2015-10-04 DIAGNOSIS — I950 Idiopathic hypotension: Secondary | ICD-10-CM | POA: Diagnosis present

## 2015-10-04 DIAGNOSIS — F0390 Unspecified dementia without behavioral disturbance: Secondary | ICD-10-CM | POA: Diagnosis present

## 2015-10-04 DIAGNOSIS — I255 Ischemic cardiomyopathy: Secondary | ICD-10-CM | POA: Diagnosis present

## 2015-10-04 DIAGNOSIS — R0602 Shortness of breath: Secondary | ICD-10-CM

## 2015-10-04 DIAGNOSIS — Z8249 Family history of ischemic heart disease and other diseases of the circulatory system: Secondary | ICD-10-CM

## 2015-10-04 DIAGNOSIS — E860 Dehydration: Secondary | ICD-10-CM | POA: Diagnosis present

## 2015-10-04 DIAGNOSIS — E782 Mixed hyperlipidemia: Secondary | ICD-10-CM | POA: Diagnosis present

## 2015-10-04 DIAGNOSIS — Z7901 Long term (current) use of anticoagulants: Secondary | ICD-10-CM | POA: Diagnosis not present

## 2015-10-04 DIAGNOSIS — I4891 Unspecified atrial fibrillation: Secondary | ICD-10-CM | POA: Diagnosis not present

## 2015-10-04 DIAGNOSIS — E440 Moderate protein-calorie malnutrition: Secondary | ICD-10-CM | POA: Diagnosis present

## 2015-10-04 DIAGNOSIS — J9691 Respiratory failure, unspecified with hypoxia: Secondary | ICD-10-CM | POA: Diagnosis present

## 2015-10-04 HISTORY — DX: Unspecified dementia, unspecified severity, without behavioral disturbance, psychotic disturbance, mood disturbance, and anxiety: F03.90

## 2015-10-04 HISTORY — DX: Essential (primary) hypertension: I10

## 2015-10-04 HISTORY — DX: Type 2 diabetes mellitus without complications: E11.9

## 2015-10-04 LAB — URINALYSIS COMPLETE WITH MICROSCOPIC (ARMC ONLY)
BILIRUBIN URINE: NEGATIVE
GLUCOSE, UA: NEGATIVE mg/dL
LEUKOCYTES UA: NEGATIVE
NITRITE: NEGATIVE
Protein, ur: 100 mg/dL — AB
Specific Gravity, Urine: 1.026 (ref 1.005–1.030)
pH: 5 (ref 5.0–8.0)

## 2015-10-04 LAB — CBC WITH DIFFERENTIAL/PLATELET
BASOS ABS: 0 10*3/uL (ref 0–0.1)
BASOS PCT: 0 %
EOS ABS: 0 10*3/uL (ref 0–0.7)
Eosinophils Relative: 0 %
HEMATOCRIT: 51.2 % (ref 40.0–52.0)
Hemoglobin: 16.1 g/dL (ref 13.0–18.0)
Lymphocytes Relative: 7 %
Lymphs Abs: 0.9 10*3/uL — ABNORMAL LOW (ref 1.0–3.6)
MCH: 28.5 pg (ref 26.0–34.0)
MCHC: 31.5 g/dL — ABNORMAL LOW (ref 32.0–36.0)
MCV: 90.4 fL (ref 80.0–100.0)
MONO ABS: 1.3 10*3/uL — AB (ref 0.2–1.0)
Monocytes Relative: 10 %
NEUTROS ABS: 10.7 10*3/uL — AB (ref 1.4–6.5)
Neutrophils Relative %: 83 %
PLATELETS: 138 10*3/uL — AB (ref 150–440)
RBC: 5.67 MIL/uL (ref 4.40–5.90)
RDW: 14.3 % (ref 11.5–14.5)
WBC: 13 10*3/uL — ABNORMAL HIGH (ref 3.8–10.6)

## 2015-10-04 LAB — PROTIME-INR
INR: 1.82
Prothrombin Time: 21 seconds — ABNORMAL HIGH (ref 11.4–15.0)

## 2015-10-04 LAB — TSH: TSH: 3.348 u[IU]/mL (ref 0.350–4.500)

## 2015-10-04 LAB — PHOSPHORUS: PHOSPHORUS: 4.9 mg/dL — AB (ref 2.5–4.6)

## 2015-10-04 LAB — COMPREHENSIVE METABOLIC PANEL
ALK PHOS: 48 U/L (ref 38–126)
ALT: 94 U/L — ABNORMAL HIGH (ref 17–63)
ANION GAP: 12 (ref 5–15)
AST: 133 U/L — ABNORMAL HIGH (ref 15–41)
Albumin: 3.7 g/dL (ref 3.5–5.0)
BUN: 47 mg/dL — ABNORMAL HIGH (ref 6–20)
CALCIUM: 9.4 mg/dL (ref 8.9–10.3)
CHLORIDE: 104 mmol/L (ref 101–111)
CO2: 25 mmol/L (ref 22–32)
Creatinine, Ser: 1.17 mg/dL (ref 0.61–1.24)
GFR calc non Af Amer: 57 mL/min — ABNORMAL LOW (ref >60)
Glucose, Bld: 154 mg/dL — ABNORMAL HIGH (ref 65–99)
Potassium: 4.8 mmol/L (ref 3.5–5.1)
SODIUM: 141 mmol/L (ref 135–145)
Total Bilirubin: 2.3 mg/dL — ABNORMAL HIGH (ref 0.3–1.2)
Total Protein: 6.2 g/dL — ABNORMAL LOW (ref 6.5–8.1)

## 2015-10-04 LAB — LIPASE, BLOOD: LIPASE: 36 U/L (ref 11–51)

## 2015-10-04 LAB — GLUCOSE, CAPILLARY: Glucose-Capillary: 140 mg/dL — ABNORMAL HIGH (ref 65–99)

## 2015-10-04 LAB — MAGNESIUM: MAGNESIUM: 2 mg/dL (ref 1.7–2.4)

## 2015-10-04 LAB — ETHANOL

## 2015-10-04 LAB — MRSA PCR SCREENING: MRSA by PCR: NEGATIVE

## 2015-10-04 LAB — TROPONIN I: TROPONIN I: 0.06 ng/mL — AB (ref <0.031)

## 2015-10-04 MED ORDER — DILTIAZEM HCL 25 MG/5ML IV SOLN
INTRAVENOUS | Status: AC
  Filled 2015-10-04: qty 5

## 2015-10-04 MED ORDER — WARFARIN SODIUM 1 MG PO TABS
5.0000 mg | ORAL_TABLET | Freq: Every day | ORAL | Status: DC
  Administered 2015-10-04: 5 mg via ORAL
  Filled 2015-10-04: qty 5
  Filled 2015-10-04: qty 1

## 2015-10-04 MED ORDER — SODIUM CHLORIDE 0.9 % IV BOLUS (SEPSIS)
500.0000 mL | Freq: Once | INTRAVENOUS | Status: AC
  Administered 2015-10-04: 500 mL via INTRAVENOUS

## 2015-10-04 MED ORDER — SODIUM CHLORIDE 0.9 % IV SOLN
INTRAVENOUS | Status: DC
  Administered 2015-10-04 – 2015-10-07 (×4): via INTRAVENOUS

## 2015-10-04 MED ORDER — DILTIAZEM HCL 30 MG PO TABS: 30.0000 mg | ORAL_TABLET | ORAL | Status: DC

## 2015-10-04 MED ORDER — CALCIUM GLUCONATE 10 % IV SOLN
1.0000 g | Freq: Once | INTRAVENOUS | Status: AC
  Administered 2015-10-04: 1 g via INTRAVENOUS

## 2015-10-04 MED ORDER — DEXTROSE 5 % IV SOLN
250.0000 mg | INTRAVENOUS | Status: DC
  Administered 2015-10-05: 250 mg via INTRAVENOUS
  Filled 2015-10-04 (×3): qty 250

## 2015-10-04 MED ORDER — MAGNESIUM SULFATE IN D5W 10-5 MG/ML-% IV SOLN
1.0000 g | Freq: Once | INTRAVENOUS | Status: AC
  Administered 2015-10-04: 1 g via INTRAVENOUS
  Filled 2015-10-04: qty 100

## 2015-10-04 MED ORDER — HEPARIN SODIUM (PORCINE) 5000 UNIT/ML IJ SOLN
5000.0000 [IU] | Freq: Three times a day (TID) | INTRAMUSCULAR | Status: DC
  Administered 2015-10-04 – 2015-10-05 (×3): 5000 [IU] via SUBCUTANEOUS
  Filled 2015-10-04 (×3): qty 1

## 2015-10-04 MED ORDER — DILTIAZEM HCL 25 MG/5ML IV SOLN
20.0000 mg | Freq: Once | INTRAVENOUS | Status: AC
  Administered 2015-10-04: 20 mg via INTRAVENOUS

## 2015-10-04 MED ORDER — SODIUM CHLORIDE 0.9 % IV SOLN: 1.0000 g | Freq: Once | INTRAVENOUS | Status: DC

## 2015-10-04 MED ORDER — SODIUM CHLORIDE 0.9 % IV BOLUS (SEPSIS): 500.0000 mL | Freq: Once | INTRAVENOUS | Status: DC

## 2015-10-04 MED ORDER — DILTIAZEM HCL 100 MG IV SOLR
5.0000 mg/h | INTRAVENOUS | Status: AC
  Administered 2015-10-04: 5 mg/h via INTRAVENOUS
  Filled 2015-10-04: qty 100

## 2015-10-04 MED ORDER — CALCIUM GLUCONATE 10 % IV SOLN
INTRAVENOUS | Status: AC
  Administered 2015-10-04: 1 g via INTRAVENOUS
  Filled 2015-10-04: qty 10

## 2015-10-04 MED ORDER — DILTIAZEM HCL 30 MG PO TABS
30.0000 mg | ORAL_TABLET | Freq: Three times a day (TID) | ORAL | Status: DC
  Administered 2015-10-04 – 2015-10-05 (×2): 30 mg via ORAL
  Filled 2015-10-04 (×2): qty 1

## 2015-10-04 MED ORDER — SODIUM CHLORIDE 0.9 % IV BOLUS (SEPSIS)
1000.0000 mL | Freq: Once | INTRAVENOUS | Status: AC
  Administered 2015-10-04: 1000 mL via INTRAVENOUS

## 2015-10-04 MED ORDER — DEXTROSE 5 % IV SOLN
500.0000 mg | Freq: Once | INTRAVENOUS | Status: AC
  Administered 2015-10-04: 500 mg via INTRAVENOUS
  Filled 2015-10-04: qty 500

## 2015-10-04 MED ORDER — HALOPERIDOL LACTATE 5 MG/ML IJ SOLN
5.0000 mg | Freq: Once | INTRAMUSCULAR | Status: AC
  Administered 2015-10-04: 5 mg via INTRAVENOUS
  Filled 2015-10-04: qty 1

## 2015-10-04 MED ORDER — DONEPEZIL HCL 5 MG PO TABS
10.0000 mg | ORAL_TABLET | Freq: Every day | ORAL | Status: DC
  Administered 2015-10-04 – 2015-10-08 (×2): 10 mg via ORAL
  Filled 2015-10-04 (×3): qty 2

## 2015-10-04 MED ORDER — HALOPERIDOL LACTATE 5 MG/ML IJ SOLN
2.5000 mg | Freq: Once | INTRAMUSCULAR | Status: AC
  Administered 2015-10-04: 2.5 mg via INTRAVENOUS
  Filled 2015-10-04: qty 1

## 2015-10-04 MED ORDER — INSULIN ASPART 100 UNIT/ML ~~LOC~~ SOLN
0.0000 [IU] | Freq: Three times a day (TID) | SUBCUTANEOUS | Status: DC
  Administered 2015-10-06 – 2015-10-09 (×4): 1 [IU] via SUBCUTANEOUS
  Administered 2015-10-09: 2 [IU] via SUBCUTANEOUS
  Administered 2015-10-09: 3 [IU] via SUBCUTANEOUS
  Filled 2015-10-04: qty 3
  Filled 2015-10-04: qty 2
  Filled 2015-10-04 (×4): qty 1

## 2015-10-04 MED ORDER — DEXTROSE 5 % IV SOLN
1.0000 g | Freq: Once | INTRAVENOUS | Status: AC
  Administered 2015-10-04: 1 g via INTRAVENOUS
  Filled 2015-10-04: qty 10

## 2015-10-04 MED ORDER — DEXTROSE 5 % IV SOLN
5.0000 mg/h | Freq: Once | INTRAVENOUS | Status: AC
  Administered 2015-10-04: 5 mg/h via INTRAVENOUS

## 2015-10-04 MED ORDER — DILTIAZEM HCL 25 MG/5ML IV SOLN
10.0000 mg | Freq: Once | INTRAVENOUS | Status: AC
  Administered 2015-10-04: 10 mg via INTRAVENOUS
  Filled 2015-10-04: qty 5

## 2015-10-04 MED ORDER — DEXTROSE 5 % IV SOLN
1.0000 g | INTRAVENOUS | Status: DC
  Administered 2015-10-05: 1 g via INTRAVENOUS
  Filled 2015-10-04 (×2): qty 10

## 2015-10-04 MED ORDER — DILTIAZEM HCL 100 MG IV SOLR
5.0000 mg/h | INTRAVENOUS | Status: DC
  Administered 2015-10-04 – 2015-10-05 (×2): 5 mg/h via INTRAVENOUS
  Filled 2015-10-04 (×2): qty 100

## 2015-10-04 MED ORDER — LORAZEPAM 2 MG/ML IJ SOLN
0.5000 mg | INTRAMUSCULAR | Status: DC | PRN
  Administered 2015-10-04 – 2015-10-07 (×7): 0.5 mg via INTRAVENOUS
  Filled 2015-10-04 (×7): qty 1

## 2015-10-04 NOTE — H&P (Signed)
[facility] [place] [facility]   PATIENT NAME: Russell Fernandez    MR#:  [id number]  DATE OF BIRTH:  1933/02/01  DATE OF ADMISSION:  09/16/2015  PRIMARY CARE PHYSICIAN: Dennison Mascot, MD   REQUESTING/REFERRING PHYSICIAN: Quale  CHIEF COMPLAINT:   Chief Complaint  Patient presents with  . Altered Mental Status    HISTORY OF PRESENT ILLNESS: Russell Fernandez  is a 81 y.o. male with a known history of dementia, diabetes, hypertension, aortic valve replacement with mechanical valve- lives alone at home his wife died in 2016/07/26and since then gradually he is getting worse and he is also under investigation by primary care and the urologist for his worsening dementia. His daughter who is power of attorney lives in [place] and has been insisting him to get to assisted living place or to rehabilitation but he denies for that he is able to leave independent life with help of daughter to help with financial things and neighbors taking him out if he wanted to go somewhere but otherwise at home he does not need any support to walk. Today history obtained from patient's daughter, she said that for last few days he has been getting progressively weak and seems not to be taking his medications. Had more confusion and so brought to emergency room. He was noted to have bilateral upper lobe pneumonia on chest x-ray and his heart rate was running fast with A. fib so given Cardizem injection multiple times to control it but it was not successful so finally started on Cardizem IV drip. Daughter confirms patient's wishes to be DO NOT RESUSCITATE and not getting on ventilator machine in any adverse situation. In ER ER physician tried to taper off the Cardizem drip after giving oral tablet for 3-4 hours, but not successful so finally given to hospitalist team for possible admission to stepdown unit.  PAST MEDICAL HISTORY:   Past Medical History  Diagnosis Date  . Dementia   . Diabetes  mellitus without complication (HCC)   . Hypertension     PAST SURGICAL HISTORY:  Past Surgical History  Procedure Laterality Date  . Aortic valve replacement      SOCIAL HISTORY:  Social History  Substance Use Topics  . Smoking status: Never Smoker   . Smokeless tobacco: Never Used  . Alcohol Use: No    FAMILY HISTORY:  Family History  Problem Relation Age of Onset  . Dementia Father   . CAD Brother     DRUG ALLERGIES:  Allergies  Allergen Reactions  . Cetirizine Other (See Comments)    Causes patient to go in AFib.    REVIEW OF SYSTEMS:   Because of dementia and change in mental status patient is not able to give me any history  MEDICATIONS AT HOME:  Prior to Admission medications   Medication Sig Start Date End Date Taking? Authorizing Provider  diltiazem (CARDIZEM) 30 MG tablet Take 1 tablet (30 mg total) by mouth 3 (three) times daily. 09/10/15   Alba Cory, MD  donepezil (ARICEPT) 10 MG tablet Take 1 tablet by mouth. 05/23/15   Historical Provider, MD      PHYSICAL EXAMINATION:   VITAL SIGNS: Blood pressure 108/72, pulse 108, temperature 97.5 F (36.4 C), temperature source Axillary, resp. rate 32, height  (1.753 m), weight 72 kg (158 lb 11.7 oz), SpO2 98 %.  GENERAL:  81 y.o.-year-old patient lying in the bed with no acute distress.  EYES: Pupils equal, round, reactive  to light . No scleral icterus. Extraocular muscles intact.  HEENT: Head atraumatic, normocephalic. Oropharynx and nasopharynx clear. Mucosa dry NECK:  Supple, no jugular venous distention. No thyroid enlargement, no tenderness.  LUNGS: Equal breath sounds bilaterally, no wheezing, coarse crepitation. No use of accessory muscles of respiration.  CARDIOVASCULAR: S1, S2 normal. No murmurs, rubs, or gallops.  ABDOMEN: Soft, nontender, nondistended. Bowel sounds present. No organomegaly or mass.  EXTREMITIES: Mild pedal edema, no cyanosis, or clubbing.  NEUROLOGIC:  Patient was  agitated initially moving all 4 limbs but not following commands, so given injection to calm him down and after that he is drowsy. PSYCHIATRIC: The patient is drowsy.  SKIN: No obvious rash, lesion, or ulcer.   LABORATORY PANEL:   CBC  Recent Labs Lab 09/26/2015 1140  WBC 13.0*  HGB 16.1  HCT 51.2  PLT 138*  MCV 90.4  MCH 28.5  MCHC 31.5*  RDW 14.3  LYMPHSABS 0.9*  MONOABS 1.3*  EOSABS 0.0  BASOSABS 0.0   ------------------------------------------------------------------------------------------------------------------  Chemistries   Recent Labs Lab 10/09/2015 1140  NA 141  K 4.8  CL 104  CO2 25  GLUCOSE 154*  BUN 47*  CREATININE 1.17  CALCIUM 9.4  MG 2.0  AST 133*  ALT 94*  ALKPHOS 48  BILITOT 2.3*   ------------------------------------------------------------------------------------------------------------------ estimated creatinine clearance is 51.2 mL/min (by C-G formula based on Cr of 1.17). ------------------------------------------------------------------------------------------------------------------  Recent Labs  09/23/2015 1140  TSH 3.348     Coagulation profile  Recent Labs Lab 09/16/2015 1140  INR 1.82   ------------------------------------------------------------------------------------------------------------------- No results for input(s): DDIMER in the last 72 hours. -------------------------------------------------------------------------------------------------------------------  Cardiac Enzymes  Recent Labs Lab 09/24/2015 1140  TROPONINI 0.06*   ------------------------------------------------------------------------------------------------------------------ Invalid input(s): POCBNP  ---------------------------------------------------------------------------------------------------------------  Urinalysis    Component Value Date/Time   COLORURINE AMBER* 09/30/2015 1140   APPEARANCEUR HAZY* 09/15/2015 1140   LABSPEC 1.026  09/19/2015 1140   PHURINE 5.0 09/19/2015 1140   GLUCOSEU NEGATIVE 09/21/2015 1140   HGBUR 1+* 09/11/2015 1140   BILIRUBINUR NEGATIVE 10/09/2015 1140   KETONESUR TRACE* 09/25/2015 1140   PROTEINUR 100* 09/22/2015 1140   NITRITE NEGATIVE 09/22/2015 1140   LEUKOCYTESUR NEGATIVE 10/02/2015 1140     RADIOLOGY: Ct Head Wo Contrast  10/01/2015  CLINICAL DATA:  Stroke EXAM: CT HEAD WITHOUT CONTRAST TECHNIQUE: Contiguous axial images were obtained from the base of the skull through the vertex without intravenous contrast. COMPARISON:  08/16/2012 FINDINGS: Global atrophy. There is no mass effect, midline shift, or acute hemorrhage. Mastoid air cells clear. Minimal mucous layering in the right maxillary sinus is not significantly changed. IMPRESSION: No acute intracranial pathology. Electronically Signed   By: Jolaine Click M.D.   On: 09/12/2015 12:02   Dg Chest Port 1 View  09/16/2015  CLINICAL DATA:  Tachycardia, altered mental status, dementia. EXAM: PORTABLE CHEST 1 VIEW COMPARISON:  Chest x-rays dated 02/17/2014 and 04/08/2013. FINDINGS: Mild cardiomegaly is unchanged. Overall cardiomediastinal silhouette is stable in size and configuration. Median sternotomy wires appear intact and stable in alignment. There are new airspace opacities within the upper lobes bilaterally, with perhaps mild central pulmonary vascular congestion. Again noted is chronic elevation of the left hemidiaphragm with probable overlying atelectasis. Right lung base is relatively clear. IMPRESSION: New airspace opacities within the upper lobes bilaterally. This may indicate congestive heart failure with asymmetric pulmonary edema. Multifocal pneumonia is certainly not excluded, especially if febrile. Interstitial fibrosis is less likely given the normal appearance of these areas on previous exam. Given the biapical distribution,  tuberculosis is also a consideration. Cardiomegaly, stable. Elevation of the left hemidiaphragm, stable.  Electronically Signed   By: Bary RichardStan  Maynard M.D.   On: 10/03/2015 11:54    IMPRESSION AND PLAN:  * Atrial fibrillation with RVR  Requiring IV Cardizem drip to rate control.  His already on Coumadin for his mechanical heart while, but INR is subtherapeutic so I'll call pharmacy to manage the dose.  TSH is checked and it appears normal.  Patient appears slightly dehydrated so that is the reason for A. fib in combination with pneumonia.  Continue oral Cardizem.  He does not recover soon we might need to call cardiology consult and further workup like echocardiogram, currently I would like to hold on that because daughter agrees patient is a DO NOT RESUSCITATE and does not want to pursue very aggressive workups.  * Pneumonia  We will give IV Rocephin and azithromycin  Iv fluids.  * Hypertension   Cont Oral cardizem.   Daughter told- she takes enalapril also- we need to confirm meds tomorrow.    Currently blood pressure is running on lower normal side so hold the medications.  * Mechanical aortic valve.   On Coumadin subtherapeutic, pharmacy to help bruising.  * Dementia  Continue Aricept.  All the records are reviewed and case discussed with ED provider. Management plans discussed with the patient, family and they are in agreement.  CODE STATUS: DO NOT RESUSCITATE Explained to daughter about the situation as patient is requiring very controlling IV medication he will need to be monitored in stepdown unit for now. She understands and in any adverse event she would not like him to go on ventilator, or to receive CPR or shock.   TOTAL TIME TAKING CARE OF THIS PATIENT: 50 critical care minutes.    Altamese DillingVACHHANI, Angelicia Lessner M.D on 09/26/2015   Between 7am to 6pm - Pager - [phone]  After 6pm go to [email or web address] - password [email or web address]  [facility] [place] [facility]  Office  [phone]  CC: Primary care physician; Dennison MascotLemont Morrisey, MD   Note: This dictation was prepared with  Dragon dictation along with smaller phrase technology. Any transcriptional errors that result from this process are unintentional.

## 2015-10-04 NOTE — ED Notes (Signed)
Cardizem in. HR at 107

## 2015-10-04 NOTE — ED Notes (Signed)
Pt from home by EMS for possible stroke like symptoms called in by family. EMS states family reported pt was decreased mental status, combative, and wouldn't open eyes. EMS reports pts heart rate was 170, they gave 10mg  of cardizem, HR down to 117. Pt lives at home alone. Pt has Hx of dementia, DM, Afib. Upon arrival to ED pt was HR 164. Last known well is unknown by family.

## 2015-10-04 NOTE — ED Notes (Addendum)
Pt unable to follow instructions, continues to pull at IV access and BP cuff.

## 2015-10-04 NOTE — ED Notes (Signed)
Pharmacy called for Cardizem drip.

## 2015-10-04 NOTE — ED Notes (Signed)
MD notified of BP changes.

## 2015-10-04 NOTE — Progress Notes (Signed)
eLink Physician-Brief Progress Note Patient Name: Russell HoltsHal F Riling DOB: 1933/11/04 MRN: [id number]   Date of Service  2014-12-04  HPI/Events of Note   chart reviewed, 81 year old male with the mechanical aortic valve, now with pneumonia with A. fib with AVR with possible pulmonary edema. -Patient's respiratory status appears to be stable. He is requiring from room air to 2 L nasal cannula.   eICU Interventions  -no acute interventions at this time.         Shane CrutchPradeep Aevah Stansbery 2014-12-04, 8:22 PM

## 2015-10-04 NOTE — Consult Note (Signed)
ANTICOAGULATION CONSULT NOTE - Initial Consult  Pharmacy Consult for Warfarin dosing  Indication: atrial fibrillation  Allergies  Allergen Reactions  . Cetirizine Other (See Comments)    Causes patient to go in AFib.    Patient Measurements: Height: 5\' 9"  (175.3 cm) Weight: 158 lb 11.7 oz (72 kg) IBW/kg (Calculated) : 70.7  Vital Signs: Temp: 97.5 F (36.4 C) (11/24 1400) Temp Source: Axillary (11/24 1400) BP: 108/72 mmHg (11/24 1731) Pulse Rate: 108 (11/24 1733)  Labs:  Recent Labs  11-24-14 1140  HGB 16.1  HCT 51.2  PLT 138*  LABPROT 21.0*  INR 1.82  CREATININE 1.17  TROPONINI 0.06*    Estimated Creatinine Clearance: 51.2 mL/min (by C-G formula based on Cr of 1.17).   Medical History: Past Medical History  Diagnosis Date  . Dementia   . Diabetes mellitus without complication (HCC)   . Hypertension     Medications:  Scheduled:  . diltiazem  30 mg Oral STAT  . warfarin  5 mg Oral q1800   Infusions:  . azithromycin    . cefTRIAXone (ROCEPHIN)  IV      Assessment: 81 yo male ordered Warfarin for A-Fib with RVR   Goal of Therapy:  INR 2-3   Plan:  Ordered Warfarin 5mg  to begin tonight.  PT/INR ordered for tomorrow morning. Will adjust dose as per consult.    Stormy CardKatsoudas,Bane Hagy K, RPh Clinical Pharmacist 2015-03-03,5:51 PM

## 2015-10-04 NOTE — ED Provider Notes (Addendum)
[facility] Emergency Department Provider Note  ____________________________________________  Time seen: 11:35 AM on arrival by EMS  I have reviewed the triage vital signs and the nursing notes.   HISTORY  Chief Complaint Altered Mental Status  History Limited by acute critical illness and chronic dementia and altered mental status.  HPI Russell Fernandez is a 81 y.o. male who lives at home alone, has dementia and manages his own medications. When his family went to visit him this morning, they found that he was confused more than usual. They noted to EMS that he does not appear to have been taking his medications as prescribed including long-acting Cardizem.     Past Medical History  Diagnosis Date  . Dementia   . Diabetes mellitus without complication [facility])      Patient Active Problem List   Diagnosis Date Noted  . Senile purpura (HCC) 09/10/2015  . CCF (congestive cardiac failure) (HCC) 07/17/2015  . Long term current use of anticoagulant 07/17/2015  . Dementia 07/17/2015  . Benign essential HTN 07/17/2015  . Hypercholesteremia 07/17/2015  . Cardiac murmur 07/17/2015  . H/O aortic valve replacement 07/17/2015  . Diabetes mellitus, type 2 (HCC) 07/27/2014  . 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 A-fib (HCC) 04/26/2014  . B12 deficiency 04/26/2014  . Absence of sensation 04/26/2014     History reviewed. No pertinent past surgical history.   Current Outpatient Rx  Name  Route  Sig  Dispense  Refill  . diltiazem (CARDIZEM) 30 MG tablet   Oral   Take 1 tablet (30 mg total) by mouth 3 (three) times daily.   90 tablet   0   . donepezil (ARICEPT) 10 MG tablet   Oral   Take 1 tablet by mouth.            Allergies Cetirizine   History reviewed. No pertinent family history.  Social History Social History  Substance Use Topics  . Smoking status: Never Smoker   . Smokeless tobacco: Never Used  . Alcohol Use: No    Review of  Systems Unable to obtain due to critical illness and acute altered mental status  ____________________________________________   PHYSICAL EXAM:  VITAL SIGNS: ED Triage Vitals  Enc Vitals Group     BP 10/09/2015 1130 127/99 mmHg     Pulse Rate 10/03/2015 1135 133     Resp 09/21/2015 1130 34     Temp 10/03/2015 1135 98 F (36.7 C)     Temp Source 09/21/2015 1135 Oral     SpO2 10/02/2015 1127 94 %     Weight 10/07/2015 1135 158 lb 11.7 oz (72 kg)     Height 10/02/2015 1135  (1.753 m)     Head Cir --      Peak Flow --      Pain Score --      Pain Loc --      Pain Edu? --      Excl. in GC? --      Constitutional:   Awake, confused. Ill-appearing Eyes:   No scleral icterus. No conjunctival pallor. PERRL. EOMI ENT   Head:   Normocephalic and atraumatic.   Nose:   No congestion/rhinnorhea. No septal hematoma   Mouth/Throat:   Dry mucous membranes, no pharyngeal erythema. No peritonsillar mass. No uvula shift.   Neck:   No stridor. No SubQ emphysema. No meningismus. Hematological/Lymphatic/Immunilogical:   No cervical lymphadenopathy. Cardiovascular:   Irregularly irregular rhythm, heart rate 140-160. Normal and symmetric  distal pulses are present in all extremities. No murmurs, rubs, or gallops. Respiratory:   Rhonchi left lung, good air entry diffusely Gastrointestinal:   Soft and nontender. No distention. There is no CVA tenderness.  No rebound, rigidity, or guarding. Genitourinary:   deferred Musculoskeletal:   Nontender with normal range of motion in all extremities. No joint effusions.  No lower extremity tenderness.  No edema. Neurologic:   Normal speech , confused and coherent language.  CN 2-10 normal. Motor grossly intact. GCS = E3V4M6 = 13 Skin:    Skin is warm, dry and intact. No rash noted.  No petechiae, purpura, or bullae. Psychiatric:   Mood and affect are normal. Confused, limited insight  ____________________________________________    LABS (pertinent  positives/negatives) (all labs ordered are listed, but only abnormal results are displayed) Labs Reviewed  CBC WITH DIFFERENTIAL/PLATELET - Abnormal; Notable for the following:    WBC 13.0 (*)    MCHC 31.5 (*)    Platelets 138 (*)    Neutro Abs 10.7 (*)    Lymphs Abs 0.9 (*)    Monocytes Absolute 1.3 (*)    All other components within normal limits  URINALYSIS COMPLETEWITH MICROSCOPIC ([facility] ONLY) - Abnormal; Notable for the following:    Color, Urine AMBER (*)    APPearance HAZY (*)    Ketones, ur TRACE (*)    Hgb urine dipstick 1+ (*)    Protein, ur 100 (*)    Bacteria, UA RARE (*)    Squamous Epithelial / LPF 0-5 (*)    All other components within normal limits  PROTIME-INR - Abnormal; Notable for the following:    Prothrombin Time 21.0 (*)    All other components within normal limits  COMPREHENSIVE METABOLIC PANEL  ETHANOL  LIPASE, BLOOD  TROPONIN I   ____________________________________________   EKG  Interpreted by me Atrial fibrillation with rapid ventricular response, normal axis, normal intervals, poor R-wave progression in anterior precordial leads, normal ST segments. There are T-wave inversions in V6  ____________________________________________    RADIOLOGY  CT head unremarkable Chest x-ray reveals bilateral upper lung infiltrates consistent with pneumonia  ____________________________________________   PROCEDURES CRITICAL CARE Performed by: Scotty CourtSTAFFORD, Arda Keadle   Total critical care time: 35 minutes  Critical care time was exclusive of separately billable procedures and treating other patients.  Critical care was necessary to treat or prevent imminent or life-threatening deterioration.  Critical care was time spent personally by me on the following activities: development of treatment plan with patient and/or surrogate as well as nursing, discussions with consultants, evaluation of patient's response to treatment, examination of patient, obtaining  history from patient or surrogate, ordering and performing treatments and interventions, ordering and review of laboratory studies, ordering and review of radiographic studies, pulse oximetry and re-evaluation of patient's condition.   ____________________________________________   INITIAL IMPRESSION / ASSESSMENT AND PLAN / ED COURSE  Pertinent labs & imaging results that were available during my care of the patient were reviewed by me and considered in my medical decision making (see chart for details).  Patient seen immediately on arrival. EMS gave 10 mg of IV Cardizem for the tachycardia with only temporary improvement of his heart rate. We then gave 20 mg of IV Cardizem without improvement. Blood pressure remained stable. We will start a Cardizem drip. Likely his A. fib is out-of-control due to Cardizem noncompliance. In addition, he is found to have bilateral pneumonia. Due to the underlying A. fib, we will avoid Levaquin since this may worsen  his rate control, and will instead use ceftriaxone and azithromycin. We'll plan to hospitalize the patient for further management.  ----------------------------------------- 1:19 PM on 24-Oct-2015 -----------------------------------------  Patient also somewhat agitated, likely combination of delirium and exacerbation of his chronic dementia due to acute illness. We'll give him a low-dose Haldol as a safety measure both for his comfort but also to protect from pulling his IV. ----------------------------------------- 3:12 PM on 10/24/2015 -----------------------------------------  Patient continues to improve with diltiazem and magnesium and IV fluids. Rate is now 110-120. Blood pressure stable. Given IV calcium gluconate as well. Plan to wean from diltiazem drip. Case is been discussed with the hospitalist who will follow along as well for admission planning, likely to telemetry floor. Patient ordered repeat dose of Haldol for delirium. Further ED  care by Dr. Fanny Bien and hospitalist Dr. Madelon Lips.  ____________________________________________   FINAL CLINICAL IMPRESSION(S) / ED DIAGNOSES  Final diagnoses:  Atrial fibrillation with rapid ventricular response (HCC)  Bilateral pneumonia      Sharman Cheek, MD 10/24/2015 1222  Sharman Cheek, MD October 24, 2015 1320  Sharman Cheek, MD 24-Oct-2015 1513

## 2015-10-05 ENCOUNTER — Inpatient Hospital Stay
Admit: 2015-10-05 | Discharge: 2015-10-05 | Disposition: A | Discharge status: Home / Self Care | Payer: Medicare Other | Attending: Internal Medicine | Admitting: Internal Medicine

## 2015-10-05 LAB — CBC
HCT: 44.6 % (ref 40.0–52.0)
Hemoglobin: 14.2 g/dL (ref 13.0–18.0)
MCH: 28.6 pg (ref 26.0–34.0)
MCHC: 31.9 g/dL — ABNORMAL LOW (ref 32.0–36.0)
MCV: 89.8 fL (ref 80.0–100.0)
PLATELETS: 98 10*3/uL — AB (ref 150–440)
RBC: 4.97 MIL/uL (ref 4.40–5.90)
RDW: 14.2 % (ref 11.5–14.5)
WBC: 11.6 10*3/uL — ABNORMAL HIGH (ref 3.8–10.6)

## 2015-10-05 LAB — BASIC METABOLIC PANEL
Anion gap: 6 (ref 5–15)
BUN: 40 mg/dL — ABNORMAL HIGH (ref 6–20)
CALCIUM: 8.5 mg/dL — AB (ref 8.9–10.3)
CO2: 26 mmol/L (ref 22–32)
CREATININE: 1.02 mg/dL (ref 0.61–1.24)
Chloride: 109 mmol/L (ref 101–111)
GFR calc non Af Amer: >60 mL/min (ref >60)
Glucose, Bld: 118 mg/dL — ABNORMAL HIGH (ref 65–99)
Potassium: 3.9 mmol/L (ref 3.5–5.1)
SODIUM: 141 mmol/L (ref 135–145)

## 2015-10-05 LAB — HEPATIC FUNCTION PANEL
ALT: 89 U/L — ABNORMAL HIGH (ref 17–63)
AST: 88 U/L — ABNORMAL HIGH (ref 15–41)
Albumin: 2.8 g/dL — ABNORMAL LOW (ref 3.5–5.0)
Alkaline Phosphatase: 37 U/L — ABNORMAL LOW (ref 38–126)
BILIRUBIN INDIRECT: 0.8 mg/dL (ref 0.3–0.9)
Bilirubin, Direct: 0.6 mg/dL — ABNORMAL HIGH (ref 0.1–0.5)
TOTAL PROTEIN: 5.1 g/dL — AB (ref 6.5–8.1)
Total Bilirubin: 1.4 mg/dL — ABNORMAL HIGH (ref 0.3–1.2)

## 2015-10-05 LAB — PROTIME-INR
INR: 1.69
PROTHROMBIN TIME: 19.9 s — AB (ref 11.4–15.0)

## 2015-10-05 LAB — GLUCOSE, CAPILLARY
GLUCOSE-CAPILLARY: 113 mg/dL — AB (ref 65–99)
GLUCOSE-CAPILLARY: 117 mg/dL — AB (ref 65–99)
GLUCOSE-CAPILLARY: 108 mg/dL — AB (ref 65–99)

## 2015-10-05 MED ORDER — WARFARIN SODIUM 1 MG PO TABS
2.5000 mg | ORAL_TABLET | Freq: Every day | ORAL | Status: DC
  Administered 2015-10-07: 2.5 mg via ORAL
  Filled 2015-10-05 (×2): qty 3

## 2015-10-05 MED ORDER — DILTIAZEM HCL ER COATED BEADS 120 MG PO CP24
240.0000 mg | ORAL_CAPSULE | Freq: Every day | ORAL | Status: DC
  Administered 2015-10-05 – 2015-10-09 (×2): 240 mg via ORAL
  Filled 2015-10-05 (×2): qty 2

## 2015-10-05 MED ORDER — DIVALPROEX SODIUM 125 MG PO CSDR
125.0000 mg | DELAYED_RELEASE_CAPSULE | Freq: Two times a day (BID) | ORAL | Status: DC
  Administered 2015-10-08 – 2015-10-09 (×2): 125 mg via ORAL
  Filled 2015-10-05 (×3): qty 1

## 2015-10-05 MED ORDER — WARFARIN - PHARMACIST DOSING INPATIENT
Freq: Every day | Status: DC
  Administered 2015-10-07: 19:00:00
  Administered 2015-10-09: 2

## 2015-10-05 MED ORDER — HALOPERIDOL LACTATE 5 MG/ML IJ SOLN
2.5000 mg | Freq: Four times a day (QID) | INTRAMUSCULAR | Status: DC | PRN
  Administered 2015-10-05 – 2015-10-07 (×5): 2.5 mg via INTRAVENOUS
  Filled 2015-10-05 (×6): qty 1

## 2015-10-05 MED ORDER — CETYLPYRIDINIUM CHLORIDE 0.05 % MT LIQD
7.0000 mL | Freq: Two times a day (BID) | OROMUCOSAL | Status: DC
  Administered 2015-10-05 – 2015-10-09 (×6): 7 mL via OROMUCOSAL

## 2015-10-05 MED ORDER — SODIUM CHLORIDE 0.9 % IV SOLN
Freq: Once | INTRAVENOUS | Status: AC
  Administered 2015-10-05: 10:00:00 via INTRAVENOUS

## 2015-10-05 MED ORDER — WARFARIN SODIUM 1 MG PO TABS: 7.5000 mg | ORAL_TABLET | Freq: Every day | ORAL | Status: DC

## 2015-10-05 MED ORDER — ENOXAPARIN SODIUM 40 MG/0.4ML ~~LOC~~ SOLN
40.0000 mg | SUBCUTANEOUS | Status: DC
  Administered 2015-10-05 – 2015-10-06 (×2): 40 mg via SUBCUTANEOUS
  Filled 2015-10-05 (×2): qty 0.4

## 2015-10-05 MED ORDER — AMIODARONE HCL IN DEXTROSE 360-4.14 MG/200ML-% IV SOLN
30.0000 mg/h | INTRAVENOUS | Status: DC
  Administered 2015-10-05 – 2015-10-06 (×3): 30 mg/h via INTRAVENOUS
  Filled 2015-10-05 (×7): qty 200

## 2015-10-05 MED ORDER — AMIODARONE HCL IN DEXTROSE 360-4.14 MG/200ML-% IV SOLN
60.0000 mg/h | INTRAVENOUS | Status: AC
  Administered 2015-10-05: 60 mg/h via INTRAVENOUS
  Filled 2015-10-05: qty 200

## 2015-10-05 MED ORDER — DONEPEZIL HCL 5 MG PO TABS: 10.0000 mg | ORAL_TABLET | Freq: Every day | ORAL | Status: DC

## 2015-10-05 NOTE — Progress Notes (Signed)
Pharmacy Antibiotic Time-Out Note  Russell Fernandez is a 81 y.o. year-old male admitted on 09/20/2015.  The patient is currently on ceftriaxone and azithromycin for PNA.   Assessment/Plan: After discussion with Dr. Luberta MutterKonidena, the length of therapy for azithromycin and ceftriaxone for PNA is 5 and 7 days respectively.   Temp (24hrs), Avg:97.2 F (36.2 C), Min:97 F (36.1 C), Max:97.5 F (36.4 C)   Recent Labs Lab 10/06/2015 1140 10/05/15 0634  WBC 13.0* 11.6*    Recent Labs Lab 09/28/2015 1140 10/05/15 0634  CREATININE 1.17 1.02   Estimated Creatinine Clearance: 58.7 mL/min (by C-G formula based on Cr of 1.02).   Antimicrobial allergies: none  Antimicrobials this admission: azithromycin 11/24 >>  ceftriaxone 11/24 >>   Microbiology Results: 11/24 BCx: NTD X 2 11/24 MRSA PCR: negative  Russell Fernandez, Russell Fernandez D PharmD 10/05/2015 3:06 PM

## 2015-10-05 NOTE — Progress Notes (Signed)
SLP Cancellation Note  Patient Details Name: [name] MRN: [id number] DOB: Mar 23, 1933   Cancelled treatment:       Reason Eval/Treat Not Completed: Fatigue/lethargy limiting ability to participate Reviewed chart and spoke w/nsg. Pt is currently very agitated and no able to complete swallow eval at this time. Recommend continue NPO and will re-attempt tomorrow.    Binford,Maaran 10/05/2015, 4:16 PM

## 2015-10-05 NOTE — Progress Notes (Signed)
Pt started on amio drip today and cardizem drip was discontinued. Pt confused and agitated at times. PRN haldol given once as ordered. Sitter at bedside. U/O adequate. A. Fib on monitor.

## 2015-10-05 NOTE — Progress Notes (Signed)
[facility] [place] [facility]   PATIENT NAME: Russell Fernandez    MR#:  [id number]  DATE OF BIRTH:  [birth year]  SUBJECTIVE: 81 year old male patient with dementia, diabetes, I think replacement, hypertension brought in by family secondary to worsening altered mental status. Found to have pneumonia, atrial fibrillation with RVR on admission. So he admitted to intensive care unit. Right now on Cardizem drip 5 of milligrams per hour, IV  IV fluids.disoriented to time place percent trying to pull out IVs. Daughter at the bedside. Agitated at times and wants to go home.   CHIEF COMPLAINT:   Chief Complaint  Patient presents with  . Altered Mental Status    REVIEW OF SYSTEMS:   ROS CONSTITUTIONAL: No fever, fatigue or weakness.  EYES: No blurred or double vision.  EARS, NOSE, AND THROAT: No tinnitus or ear pain.  RESPIRATORY: No cough, shortness of breath, wheezing or hemoptysis.  CARDIOVASCULAR: No chest pain, orthopnea, edema.  GASTROINTESTINAL: No nausea, vomiting, diarrhea or abdominal pain.  GENITOURINARY: No dysuria, hematuria.  ENDOCRINE: No polyuria, nocturia,  HEMATOLOGY: No anemia, easy bruising or bleeding SKIN: No rash or lesion. MUSCULOSKELETAL: No joint pain or arthritis.   NEUROLOGIC: No tingling, numbness, weakness.  PSYCHIATRY demented  DRUG ALLERGIES:   Allergies  Allergen Reactions  . Cetirizine Other (See Comments)    Causes patient to go in AFib.    VITALS:  Blood pressure 92/82, pulse 32, temperature 97.5 F (36.4 C), temperature source Axillary, resp. rate 27, height  (1.753 m), weight 72 kg (158 lb 11.7 oz), SpO2 100 %.  PHYSICAL EXAMINATION:  GENERAL:  81 y.o.-year-old patient lying in the bed with no acute distress.  EYES: Pupils equal, round, reactive to light and accommodation. No scleral icterus. Extraocular muscles intact.  HEENT: Head atraumatic, normocephalic. Oropharynx and nasopharynx clear.  NECK:  Supple, no  jugular venous distention. No thyroid enlargement, no tenderness.  LUNGS: Normal breath sounds bilaterally, no wheezing, rales,rhonchi or crepitation. No use of accessory muscles of respiration.  CARDIOVASCULAR: S1, S2 normal. No murmurs, rubs, or gallops.  ABDOMEN: Soft, nontender, nondistended. Bowel sounds present. No organomegaly or mass.  EXTREMITIES: No pedal edema, cyanosis, or clubbing.  NEUROLOGIC: Cranial nerves II through XII are intact. Muscle strength 5/5 in all extremities. Sensation intact. Gait not checked.  PSYCHIATRIC:not  oriented to time, place, person.  SKIN: No obvious rash, lesion, or ulcer.    LABORATORY PANEL:   CBC  Recent Labs Lab 10/05/15 0634  WBC 11.6*  HGB 14.2  HCT 44.6  PLT 98*   ------------------------------------------------------------------------------------------------------------------  Chemistries   Recent Labs Lab 2015-10-27 1140 10/05/15 0634  NA 141 141  K 4.8 3.9  CL 104 109  CO2 25 26  GLUCOSE 154* 118*  BUN 47* 40*  CREATININE 1.17 1.02  CALCIUM 9.4 8.5*  MG 2.0  --   AST 133* 88*  ALT 94* 89*  ALKPHOS 48 37*  BILITOT 2.3* 1.4*   ------------------------------------------------------------------------------------------------------------------  Cardiac Enzymes  Recent Labs Lab 2015-10-27 1140  TROPONINI 0.06*   ------------------------------------------------------------------------------------------------------------------  RADIOLOGY:  Ct Head Wo Contrast  10-27-15  CLINICAL DATA:  Stroke EXAM: CT HEAD WITHOUT CONTRAST TECHNIQUE: Contiguous axial images were obtained from the base of the skull through the vertex without intravenous contrast. COMPARISON:  08/16/2012 FINDINGS: Global atrophy. There is no mass effect, midline shift, or acute hemorrhage. Mastoid air cells clear. Minimal mucous layering in the right maxillary sinus is not significantly changed. IMPRESSION: No acute intracranial  pathology. Electronically  Signed   By: Jolaine ClickArthur  Hoss M.D.   On: April 24, 2015 12:02   Dg Chest Port 1 View  April 24, 2015  CLINICAL DATA:  Tachycardia, altered mental status, dementia. EXAM: PORTABLE CHEST 1 VIEW COMPARISON:  Chest x-rays dated 02/17/2014 and 04/08/2013. FINDINGS: Mild cardiomegaly is unchanged. Overall cardiomediastinal silhouette is stable in size and configuration. Median sternotomy wires appear intact and stable in alignment. There are new airspace opacities within the upper lobes bilaterally, with perhaps mild central pulmonary vascular congestion. Again noted is chronic elevation of the left hemidiaphragm with probable overlying atelectasis. Right lung base is relatively clear. IMPRESSION: New airspace opacities within the upper lobes bilaterally. This may indicate congestive heart failure with asymmetric pulmonary edema. Multifocal pneumonia is certainly not excluded, especially if febrile. Interstitial fibrosis is less likely given the normal appearance of these areas on previous exam. Given the biapical distribution, tuberculosis is also a consideration. Cardiomegaly, stable. Elevation of the left hemidiaphragm, stable. Electronically Signed   By: Bary RichardStan  Maynard M.D.   On: April 24, 2015 11:54    EKG:   Orders placed or performed during the hospital encounter of 2015/06/19  . ED EKG  . ED EKG  . EKG 12-Lead  . EKG 12-Lead    ASSESSMENT AND PLAN:   #1. Multifocal pneumonia: WBC is down, continue Rocephin, Zithromax, Urine for Legionella antigen. Continue oxygen. #2 chronic atrial fibrillation with RVR: Consult cardiology, continue Cardizem drip, continue full dose anticoagulation with Coumadin. Follows up with Dr. Maurice Smallallwoodas an outpatient. Supposed to take Cardizem CD 120 mg in a patient but I'm not sure if he is taking them #3 history of mechanical aortic valve.; continue coumadin 4/dementia with memory problems: Follows up with Dr. Malvin JohnsPotter, started on Aricept, Depakote not sure if he is taking them: Restart  them now. Patient lives alone and the daughter is concerned about this because of his dementia and he refused to come to Hospital yesterday also. We will request psych consult for competency evaluation, she is requesting placement for him  (5 . Nutrition: Speech evaluation today.    All the records are reviewed and case discussed with Care Management/Social Workerr. Management plans discussed with the patient, family and they are in agreement.  CODE STATUS: DO NOT RESUSCITATE  TOTAL TIME TAKING CARE OF THIS PATIENT: 40 minutes.Critical careE  D/C IN 5-6 DAYS, DEPENDING ON CLINICAL CONDITION.   Katha HammingKONIDENA,Porscha Axley M.D on 10/05/2015 at 2:03 PM  Between 7am to 6pm - Pager - [phone]  After 6pm go to [email or web address] - password [email or web address]  [facility]  Office  [phone]  CC: Primary care physician; Dennison MascotLemont Morrisey, MD   Note: This dictation was prepared with Dragon dictation along with smaller phrase technology. Any transcriptional errors that result from this process are unintentional.

## 2015-10-05 NOTE — Consult Note (Addendum)
ANTICOAGULATION CONSULT NOTE - Follow Up  Pharmacy Consult for Warfarin dosing  Indication: atrial fibrillation  Allergies  Allergen Reactions  . Cetirizine Other (See Comments)    Causes patient to go in AFib.    Patient Measurements: Height: 5\' 9"  (175.3 cm) Weight: 158 lb 11.7 oz (72 kg) IBW/kg (Calculated) : 70.7  Vital Signs: Temp: 97.5 F (36.4 C) (11/25 0700) Temp Source: Axillary (11/25 0700) BP: 92/82 mmHg (11/25 1300) Pulse Rate: 32 (11/25 1300)  Labs:  Recent Labs  10/07/2015 1140 10/05/15 0634  HGB 16.1 14.2  HCT 51.2 44.6  PLT 138* 98*  LABPROT 21.0* 19.9*  INR 1.82 1.69  CREATININE 1.17 1.02  TROPONINI 0.06*  --     Estimated Creatinine Clearance: 58.7 mL/min (by C-G formula based on Cr of 1.02).   Medical History: Past Medical History  Diagnosis Date  . Dementia   . Diabetes mellitus without complication (HCC)   . Hypertension     Medications:  Scheduled:  . antiseptic oral rinse  7 mL Mouth Rinse BID  . azithromycin  250 mg Intravenous Q24H  . cefTRIAXone (ROCEPHIN)  IV  1 g Intravenous Q24H  . diltiazem  240 mg Oral Daily  . divalproex  125 mg Oral Q12H  . donepezil  10 mg Oral QHS  . heparin  5,000 Units Subcutaneous 3 times per day  . insulin aspart  0-9 Units Subcutaneous TID WC  . warfarin  7.5 mg Oral q1800  . Warfarin - Pharmacist Dosing Inpatient   Does not apply q1800   Infusions:  . sodium chloride 75 mL/hr at 10/05/15 0821  . diltiazem (CARDIZEM) infusion 5 mg/hr (10/05/15 1354)    Assessment: 81 yo male with mechanical aortic valve and A. Fib with RVR. Unclear if patient was actually taking warfarin at home, his outpatient pharmacy says he has not had it filled since July.  INR at admission on 11/24  1.82. 5mg  given and INR 1.69 today.   Goal of Therapy:  INR 2-3   Plan:  Patient initiated on amiodarone drip. Due to amiodarone, will continue patient on warfarin 5mg  q1800. Will need to consider decreasing dose once  therapeutic, due to interaction with amiodarone. Will continue to monitor daily.   Pharmacy will continue to monitor and adjust per consult.    Cher NakaiSheema Hallaji, PharmD Pharmacy Resident 10/05/2015 2:52 PM

## 2015-10-05 NOTE — Progress Notes (Signed)
*  PRELIMINARY RESULTS* °Echocardiogram °2D Echocardiogram has been performed. ° °Russell Fernandez °10/05/2015, 11:39 AM °

## 2015-10-05 NOTE — Clinical Social Work Note (Signed)
Clinical Social Work Assessment  Patient Details  Name: Russell Fernandez MRN: [id number] Date of Birth: April 20, 1933  Date of referral:  10/05/15               Reason for consult:  Rule-out Psychosocial, Facility Placement (possible placement)                Permission sought to share information with:  Family Supports Permission granted to share information::     Name::      (daughter POA Shelle Iron [phone])  Agency::     Relationship::     Contact Information:     Housing/Transportation Living arrangements for the past 2 months:  Single Family Home Source of Information:  Medical Team, Adult Children Patient Interpreter Needed:  None Criminal Activity/Legal Involvement Pertinent to Current Situation/Hospitalization:  No - Comment as needed Significant Relationships:  Adult Children, Church, Network engineer Lives with:  Self Do you feel safe going back to the place where you live?   (patient unable to answer, daughter is comfortable with patient returning to his hm) Need for family participation in patient care:  Yes (Comment)  Care giving concerns:  Concern that patient is unable to care for himself at home.   Social Worker assessment / plan:  CSW unable to talk with patient, he was resting. Did not wake patient as he has been agitated.  Per medical record patient is a poor historian due to his dementia. Patient is a 81 year old male that currently lives alone, his wife recently passed away 05-13-15.  Patient's only daughter Shelle Iron [phone] is POA and lives in [place]. Call to patient's daughter, she came to visit patient for Thanksgiving and found him too weak to get out of bed and called EMS.  Patient had home health in the past for wellness check and managing medication, however he has refused to open the door to allow them to come in.  Per daughter patient has not been cooperative. Patient's local support system is limited with church family and his neighbors.  Patient goes out to  dinner with his neighbors and make his own lunch and breakfast.     Daughter has reported concerns of patient not taking his medication and discussed placement. CSW shared information on short term and long-term placement. Per daughter Patient has a home with a reverse mortgage, two automobiles and "some $ in the bank". Patient miss placed his keys so he has not been able to drive.  Daughter informed CSW she had to return to [place] to take her children home but would return next week. However, she is available via phone.  Clinical social worker will continue to follow patient to assist with ongoing and disposition needs  Employment status:  Disabled (Comment on whether or not currently receiving Disability) Insurance information:  Managed Medicare PT Recommendations:  Not assessed at this time Information / Referral to community resources:   (none at this time)  Patient/Family's Response to care:  Patient's daughter was appreciative of information provided by CSW..   Patient/Family's Understanding of and Emotional Response to Diagnosis, Current Treatment, and Prognosis:  Patient's daughter  understands that patient is under continued medical work up at this time.    Emotional Assessment Appearance:  Appears stated age Attitude/Demeanor/Rapport:  Combative Affect (typically observed):    Orientation:   (unable to assess at this time) Alcohol / Substance use:  Never Used Psych involvement (Current and /or in the community):  No (Comment)  Discharge Needs  Concerns to be addressed:  Care Coordination, Discharge Planning Concerns Readmission within the last 30 days:  No Current discharge risk:  Lack of support system, Chronically ill, Dependent with Mobility, Lives alone Barriers to Discharge:  Continued Medical Work up   Soundra PilonMoore, Deborah H, LCSW 10/05/2015, 4:43 PM

## 2015-10-06 LAB — BASIC METABOLIC PANEL
ANION GAP: 8 (ref 5–15)
BUN: 43 mg/dL — ABNORMAL HIGH (ref 6–20)
CALCIUM: 8.6 mg/dL — AB (ref 8.9–10.3)
CO2: 24 mmol/L (ref 22–32)
Chloride: 110 mmol/L (ref 101–111)
Creatinine, Ser: 0.99 mg/dL (ref 0.61–1.24)
GLUCOSE: 141 mg/dL — AB (ref 65–99)
POTASSIUM: 4.2 mmol/L (ref 3.5–5.1)
SODIUM: 142 mmol/L (ref 135–145)

## 2015-10-06 LAB — PROTIME-INR
INR: 2.15
PROTHROMBIN TIME: 23.8 s — AB (ref 11.4–15.0)

## 2015-10-06 LAB — GLUCOSE, CAPILLARY
GLUCOSE-CAPILLARY: 124 mg/dL — AB (ref 65–99)
GLUCOSE-CAPILLARY: 86 mg/dL (ref 65–99)
GLUCOSE-CAPILLARY: 93 mg/dL (ref 65–99)
Glucose-Capillary: 74 mg/dL (ref 65–99)
Glucose-Capillary: 80 mg/dL (ref 65–99)

## 2015-10-06 MED ORDER — DILTIAZEM HCL 25 MG/5ML IV SOLN
10.0000 mg | Freq: Once | INTRAVENOUS | Status: AC
  Administered 2015-10-06: 10 mg via INTRAVENOUS
  Filled 2015-10-06: qty 5

## 2015-10-06 NOTE — Consult Note (Signed)
ANTICOAGULATION CONSULT NOTE - Follow Up  Pharmacy Consult for Warfarin dosing  Indication: atrial fibrillation  Allergies  Allergen Reactions  . Cetirizine Other (See Comments)    Causes patient to go in AFib.   Patient Measurements: Height: 5\' 9"  (175.3 cm) Weight: 158 lb 11.7 oz (72 kg) IBW/kg (Calculated) : 70.7  Vital Signs: Temp: 96.6 F (35.9 C) (11/26 0711) Temp Source: Axillary (11/26 0711) BP: 105/84 mmHg (11/26 1100) Pulse Rate: 87 (11/26 0700)  Labs:  Recent Labs  Jun 28, 2015 1140 10/05/15 0634 10/06/15 0304  HGB 16.1 14.2  --   HCT 51.2 44.6  --   PLT 138* 98*  --   LABPROT 21.0* 19.9* 23.8*  INR 1.82 1.69 2.15  CREATININE 1.17 1.02 0.99  TROPONINI 0.06*  --   --     Estimated Creatinine Clearance: 60.5 mL/min (by C-G formula based on Cr of 0.99).   Medical History: Past Medical History  Diagnosis Date  . Dementia   . Diabetes mellitus without complication (HCC)   . Hypertension     Medications:  Scheduled:  . antiseptic oral rinse  7 mL Mouth Rinse BID  . azithromycin  250 mg Intravenous Q24H  . cefTRIAXone (ROCEPHIN)  IV  1 g Intravenous Q24H  . diltiazem  240 mg Oral Daily  . divalproex  125 mg Oral Q12H  . donepezil  10 mg Oral QHS  . enoxaparin (LOVENOX) injection  40 mg Subcutaneous Q24H  . insulin aspart  0-9 Units Subcutaneous TID WC  . warfarin  2.5 mg Oral q1800  . Warfarin - Pharmacist Dosing Inpatient   Does not apply q1800   Infusions:  . sodium chloride 75 mL/hr at 10/06/15 1100  . amiodarone 30 mg/hr (10/06/15 1100)    Assessment: 81 yo male with mechanical aortic valve and A. Fib with RVR. Unclear if patient was actually taking warfarin at home, his outpatient pharmacy says he has not had it filled since July.  INR at admission on 11/24  1.82. 5mg  given and INR was 1.69 on 11/25. 11/25  Dose held due to initiation of amiodarone drip.  11/26 INR   2.15  Goal of Therapy:  INR 2-3   Plan:  Due to amiodarone  initiation, will continue patient on warfarin 2.5 mg q1800.  INR ordered for 0500 AM tomorrow.   Pharmacy will continue to monitor and adjust per consult.    Cher NakaiSheema Marielena Harvell, PharmD Pharmacy Resident 10/06/2015 11:45 AM

## 2015-10-06 NOTE — Progress Notes (Signed)
Patient's HR sustaining in 130s-140s while on amiodarone drip. Notified MD and per MD give 10mg  cardizem IV push and monitor patient's BP. Nursing staff will continue to monitor. Lamonte RicherKara A Cynthya Yam, RN

## 2015-10-06 NOTE — Progress Notes (Signed)
Transferred to floor. Patient restless,anxious pulling at tubes. Sitter at bedside. Telemetry reading afib 126, sat 98-99 on 3l. Amiodarone drip infusing at 16.7 with ns at 74. Foley cath patent. Will continue to monitor

## 2015-10-06 NOTE — Progress Notes (Addendum)
Patient is lethargic and confused, with intermittent agitation and restlessness. Patient restless this am, pulling at IV and foley, medicated x 1 for agitation with improvement. Safety sitter at bedside. Weaned to 3l nasal cannula with oxygen saturation WNL. Remains in afib, HR 90-110. Remains NPO, pending SLP evaluation when patient is able to tolerate. Patient to be transferred to room 246, [facility] Will transfer shortly.

## 2015-10-06 NOTE — Progress Notes (Signed)
[facility] [place] at [facility]   PATIENT NAME: Russell Fernandez    MR#:  [id number]  DATE OF BIRTH:  [birth year]  SUBJECTIVE: Requesting at this time, comfortably had periods of agitation requiring IV Haldol, sitter at bedside. On amiodarone  drip for atrial fibrillation. Rate is controlled..   CHIEF COMPLAINT:   Chief Complaint  Patient presents with  . Altered Mental Status    REVIEW OF SYSTEMS:   Review of Systems  Unable to perform ROS: acuity of condition   C DRUG ALLERGIES:   Allergies  Allergen Reactions  . Cetirizine Other (See Comments)    Causes patient to go in AFib.    VITALS:  Blood pressure 100/75, pulse 87, temperature 96.6 F (35.9 C), temperature source Axillary, resp. rate 17, height 5\' 9"  (1.753 m), weight 72 kg (158 lb 11.7 oz), SpO2 100 %.  PHYSICAL EXAMINATION:  GENERAL:  81 y.o.-year-old patient lying in the bed with no acute distress.  EYES: Pupils equal, round, reactive to light and accommodation. No scleral icterus. Extraocular muscles intact.  HEENT: Head atraumatic, normocephalic. Oropharynx and nasopharynx clear.  NECK:  Supple, no jugular venous distention. No thyroid enlargement, no tenderness.  LUNGS: Normal breath sounds bilaterally, no wheezing, rales,rhonchi or crepitation. No use of accessory muscles of respiration.  CARDIOVASCULAR: S1, S2 normal. No murmurs, rubs, or gallops.  ABDOMEN: Soft, nontender, nondistended. Bowel sounds present. No organomegaly or mass.  EXTREMITIES: No pedal edema, cyanosis, or clubbing.  NEUROLOGIC: Cranial nerves II through XII are intact. Muscle strength 5/5 in all extremities. Sensation intact. Gait not checked.  PSYCHIATRIC:not  oriented to time, place, person.  SKIN: No obvious rash, lesion, or ulcer.    LABORATORY PANEL:   CBC  Recent Labs Lab 10/05/15 0634  WBC 11.6*  HGB 14.2  HCT 44.6  PLT 98*    ------------------------------------------------------------------------------------------------------------------  Chemistries   Recent Labs Lab 09/19/2015 1140 10/05/15 0634 10/06/15 0304  NA 141 141 142  K 4.8 3.9 4.2  CL 104 109 110  CO2 25 26 24   GLUCOSE 154* 118* 141*  BUN 47* 40* 43*  CREATININE 1.17 1.02 0.99  CALCIUM 9.4 8.5* 8.6*  MG 2.0  --   --   AST 133* 88*  --   ALT 94* 89*  --   ALKPHOS 48 37*  --   BILITOT 2.3* 1.4*  --    ------------------------------------------------------------------------------------------------------------------  Cardiac Enzymes  Recent Labs Lab 09/24/2015 1140  TROPONINI 0.06*   ------------------------------------------------------------------------------------------------------------------  RADIOLOGY:  No results found.  EKG:   Orders placed or performed during the hospital encounter of 10/06/2015  . ED EKG  . ED EKG  . EKG 12-Lead  . EKG 12-Lead    ASSESSMENT AND PLAN:   #1. Multifocal pneumonia vs chf: WBC is down, deescalate abx as pt  has chf rather than pneumonia  #2 chronic atrial fibrillation with RVR: Consult cardiology, started on amiodarone drip.continue as he is npo,transfer to tele  #3 history of mechanical aortic valve.; continue coumadin 4/dementia with memory problems: Follows up with Dr. Malvin JohnsPotter, started on Aricept, Depakote not sure if he is taking them: Restart them now. Patient lives alone and the daughter is concerned about this because of his dementia and he refused to come to Hospital yesterday also. We will request psych consult for competency evaluation, she is requesting placement for him   (5 . Nutrition: Speech evaluation  Suggested NPO due to his  Lethargy./continue IV fluids Consider palliative care, Transfer  to tele on amiodarone drip.  6 ,agitation and confusion; continue sitter at bedside and use when necessary Haldol.   All the records are reviewed and case discussed with Care  Management/Social Workerr. Management plans discussed with the patient, family and they are in agreement.  CODE STATUS: DO NOT RESUSCITATE  TOTAL TIME TAKING CARE OF THIS PATIENT: 40 minutes.Critical careE  D/C IN 5-6 DAYS, DEPENDING ON CLINICAL CONDITION.   Katha Hamming M.D on 10/06/2015 at 1:02 PM  Between 7am to 6pm - Pager - [phone]  After 6pm go to [email or web address] - password [email or web address]  [facility]  Office  [phone]  CC: Primary care physician; Dennison Mascot, MD   Note: This dictation was prepared with Dragon dictation along with smaller phrase technology. Any transcriptional errors that result from this process are unintentional.

## 2015-10-06 NOTE — Consult Note (Signed)
  Pt seen in R.No 246 [facility]. Pt is a poor historian and is being watched by a sitter for safety. Staff report that pt lives alone and daughter came to visit him for Thanksgiving and he was very confused and agitated. [facility] and was admitted to ICU where he was treated for Pnumonia.  Pt was stabilised and then transferred to  R.No 246 where he was seen by the undersigned. M.S. Pt  Was seen in bed and was calm. Sitter reports that he was agitated and was given Ativan which calmed him down. Further mental status could not be done and pt is confused while awake according to staff. I/J Impaired. Imp Dementia with Behavioral problems.       R/O Delirium secondary to Pnumonia. REc Continue current treatment with Haldol and Ativan on prn basis for agitation. Will re-eval tomorrow. SS is to contact family and place of living about his baseline functioning. Pt has apt with Neurology.

## 2015-10-06 NOTE — Consult Note (Signed)
[facility] Cardiology Consultation Note  Patient ID: Russell Fernandez, MRN: [id number], DOB/AGE: 1933-02-19 81 y.o. Admit date: 10/01/2015   Date of Consult: 10/06/2015 Primary Physician: Dennison Mascot, MD Primary Cardiologist: None  Chief Complaint:  Chief Complaint  Patient presents with  . Altered Mental Status   Reason for Consult: atrial fibrillation with rapid ventricular rate  HPI: 81 y.o. male with known diabetes with complications essential hypertension mixed hyperlipidemia status post aortic valve replacement in the remote plastic with dementia and new onset bilateral upper lobe pneumonia with new onset of nonvalvular atrial fibrillation with rapid ventricular rate likely secondary to current condition and illness. The patient was placed on diltiazem with poor control of issues including heart rate control. The patient was adjusted for diltiazem use throughout yesterday and have decided that the patient would need better heart rate control and possible spontaneous conversion in normal rhythm to improve current conditions and amiodarone was started. Continue the patient does have some improvements of heart rate control but remains in atrial fibrillation with much better control of heart rate. The patient previously has been on warfarin for further risk reduction in stroke and/or valvular thrombosis. The patient is demented and is unable to give any further evaluation and response  Past Medical History  Diagnosis Date  . Dementia   . Diabetes mellitus without complication (HCC)   . Hypertension       Surgical History:  Past Surgical History  Procedure Laterality Date  . Aortic valve replacement       Home Meds: Prior to Admission medications   Medication Sig Start Date End Date Taking? Authorizing Provider  diltiazem (CARDIZEM) 30 MG tablet Take 1 tablet (30 mg total) by mouth 3 (three) times daily. 09/10/15   Alba Cory, MD  donepezil (ARICEPT) 10 MG tablet Take 1  tablet by mouth. 05/23/15   Historical Provider, MD    Inpatient Medications:  . antiseptic oral rinse  7 mL Mouth Rinse BID  . azithromycin  250 mg Intravenous Q24H  . cefTRIAXone (ROCEPHIN)  IV  1 g Intravenous Q24H  . diltiazem  240 mg Oral Daily  . divalproex  125 mg Oral Q12H  . donepezil  10 mg Oral QHS  . enoxaparin (LOVENOX) injection  40 mg Subcutaneous Q24H  . insulin aspart  0-9 Units Subcutaneous TID WC  . warfarin  2.5 mg Oral q1800  . Warfarin - Pharmacist Dosing Inpatient   Does not apply q1800   . sodium chloride 75 mL/hr at 10/06/15 0700  . amiodarone 30 mg/hr (10/06/15 0700)    Allergies:  Allergies  Allergen Reactions  . Cetirizine Other (See Comments)    Causes patient to go in AFib.    Social History   Social History  . Marital Status: Widowed    Spouse Name: N/A  . Number of Children: N/A  . Years of Education: N/A   Occupational History  . Not on file.   Social History Main Topics  . Smoking status: Never Smoker   . Smokeless tobacco: Never Used  . Alcohol Use: No  . Drug Use: No  . Sexual Activity: Not Currently   Other Topics Concern  . Not on file   Social History Narrative     Family History  Problem Relation Age of Onset  . Dementia Father   . CAD Brother      Review of Systems Positive for none due to dementia   Labs:  Recent Labs  09/15/2015 1140  TROPONINI 0.06*   Lab Results  Component Value Date   WBC 11.6* 10/05/2015   HGB 14.2 10/05/2015   HCT 44.6 10/05/2015   MCV 89.8 10/05/2015   PLT 98* 10/05/2015    Recent Labs Lab 10/05/15 0634 10/06/15 0304  NA 141 142  K 3.9 4.2  CL 109 110  CO2 26 24  BUN 40* 43*  CREATININE 1.02 0.99  CALCIUM 8.5* 8.6*  PROT 5.1*  --   BILITOT 1.4*  --   ALKPHOS 37*  --   ALT 89*  --   AST 88*  --   GLUCOSE 118* 141*   Lab Results  Component Value Date   CHOL 108 02/18/2014   HDL 35* 02/18/2014   LDLCALC 60 02/18/2014   TRIG 64 02/18/2014   No results found  for: DDIMER  Radiology/Studies:  Ct Head Wo Contrast  05-Oct-2015  CLINICAL DATA:  Stroke EXAM: CT HEAD WITHOUT CONTRAST TECHNIQUE: Contiguous axial images were obtained from the base of the skull through the vertex without intravenous contrast. COMPARISON:  08/16/2012 FINDINGS: Global atrophy. There is no mass effect, midline shift, or acute hemorrhage. Mastoid air cells clear. Minimal mucous layering in the right maxillary sinus is not significantly changed. IMPRESSION: No acute intracranial pathology. Electronically Signed   By: Jolaine Click M.D.   On: 10/05/2015 12:02   Dg Chest Port 1 View  October 05, 2015  CLINICAL DATA:  Tachycardia, altered mental status, dementia. EXAM: PORTABLE CHEST 1 VIEW COMPARISON:  Chest x-rays dated 02/17/2014 and 04/08/2013. FINDINGS: Mild cardiomegaly is unchanged. Overall cardiomediastinal silhouette is stable in size and configuration. Median sternotomy wires appear intact and stable in alignment. There are new airspace opacities within the upper lobes bilaterally, with perhaps mild central pulmonary vascular congestion. Again noted is chronic elevation of the left hemidiaphragm with probable overlying atelectasis. Right lung base is relatively clear. IMPRESSION: New airspace opacities within the upper lobes bilaterally. This may indicate congestive heart failure with asymmetric pulmonary edema. Multifocal pneumonia is certainly not excluded, especially if febrile. Interstitial fibrosis is less likely given the normal appearance of these areas on previous exam. Given the biapical distribution, tuberculosis is also a consideration. Cardiomegaly, stable. Elevation of the left hemidiaphragm, stable. Electronically Signed   By: Bary Richard M.D.   On: 10/05/15 11:54    EKG: Atrial fibrillation with rapid ventricular rate and nonspecific ST and T-wave changes  Weights: Filed Weights   Oct 05, 2015 1135  Weight: 158 lb 11.7 oz (72 kg)     Physical Exam: Blood pressure  106/90, pulse 87, temperature 96.6 F (35.9 C), temperature source Axillary, resp. rate 17, height  (1.753 m), weight 158 lb 11.7 oz (72 kg), SpO2 100 %. Body mass index is 23.43 kg/(m^2). General: Well developed, well nourished, in no acute distress. Head eyes ears nose throat: Normocephalic, atraumatic, sclera non-icteric, no xanthomas, nares are without discharge. No apparent thyromegaly and/or mass  Lungs: Normal respiratory effort.  Diffuse wheezes, no rales, upper rhonchi.  Heart: Irregular with normal S1 mechanical S2. 2-3+ aortic murmur gallop, no rub, PMI is normal size and placement, carotid upstroke normal without bruit, jugular venous pressure is normal Abdomen: Soft, non-tender, non-distended with normoactive bowel sounds. No hepatomegaly. No rebound/guarding. No obvious abdominal masses. Abdominal aorta is normal size without bruit Extremities: Trace edema. no cyanosis, no clubbing, no ulcers  Peripheral : 2+ bilateral upper extremity pulses, 2+ bilateral femoral pulses, 2+ bilateral dorsal pedal pulse Neuro: Not Alert and oriented. No facial asymmetry. No  focal deficit. Moves all extremities spontaneously. Musculoskeletal: Normal muscle tone without kyphosis Psych:  Does not Responds to questions appropriately with a normal affect.    Assessment: 81 year old male with known diabetes with complications essential hypertension makes hyperlipidemia dementia with previous aortic valve replacement with new onset of atrial fibrillation nonvalvular in nature with rapid ventricular rate due to bilateral upper lobe pneumonia without current evidence of heart failure and/or myocardial infarction  Plan: 1. Continue amiodarone drip for 24 more hours for possible spontaneous conversion to normal sinus rhythm as patient improves from the infection standpoint 2. Warfarin for further risk reduction of the valvular thrombosis as well as further risk reduction in stroke with atrial  fibrillation 3. Continue diltiazem long acting for heart rate control and adjust medication management depending on above 4. Echocardiogram for LV systolic dysfunction valvular heart disease contributing to above 5. Further diagnostic testing and treatment options after above  Signed, Lamar BlinksKOWALSKI,Santo Zahradnik J M.D. [facility] 10/06/2015, 7:22 AM

## 2015-10-07 ENCOUNTER — Inpatient Hospital Stay: Payer: Medicare Other

## 2015-10-07 LAB — BLOOD GAS, ARTERIAL
ACID-BASE DEFICIT: 5.6 mmol/L — AB (ref 0.0–2.0)
BICARBONATE: 20.1 meq/L — AB (ref 21.0–28.0)
FIO2: 0.4
O2 Saturation: 93.6 %
PH ART: 7.32 — AB (ref 7.350–7.450)
PO2 ART: 75 mmHg — AB (ref 83.0–108.0)
Patient temperature: 37
pCO2 arterial: 39 mmHg (ref 32.0–48.0)

## 2015-10-07 LAB — GLUCOSE, CAPILLARY
GLUCOSE-CAPILLARY: 109 mg/dL — AB (ref 65–99)
Glucose-Capillary: 131 mg/dL — ABNORMAL HIGH (ref 65–99)
Glucose-Capillary: 109 mg/dL — ABNORMAL HIGH (ref 65–99)
Glucose-Capillary: 143 mg/dL — ABNORMAL HIGH (ref 65–99)
Glucose-Capillary: 97 mg/dL (ref 65–99)

## 2015-10-07 LAB — CBC
HEMATOCRIT: 47 % (ref 40.0–52.0)
HEMOGLOBIN: 15 g/dL (ref 13.0–18.0)
MCH: 28.9 pg (ref 26.0–34.0)
MCHC: 31.9 g/dL — ABNORMAL LOW (ref 32.0–36.0)
MCV: 90.6 fL (ref 80.0–100.0)
Platelets: 98 10*3/uL — ABNORMAL LOW (ref 150–440)
RBC: 5.19 MIL/uL (ref 4.40–5.90)
RDW: 14.6 % — ABNORMAL HIGH (ref 11.5–14.5)
WBC: 12.7 10*3/uL — AB (ref 3.8–10.6)

## 2015-10-07 LAB — PROTIME-INR
INR: 2.39
Prothrombin Time: 25.8 seconds — ABNORMAL HIGH (ref 11.4–15.0)

## 2015-10-07 MED ORDER — DILTIAZEM HCL 100 MG IV SOLR
5.0000 mg/h | INTRAVENOUS | Status: DC
  Administered 2015-10-07: 15 mg/h via INTRAVENOUS
  Administered 2015-10-07 – 2015-10-08 (×4): 10 mg/h via INTRAVENOUS
  Administered 2015-10-08: 12.5 mg/h via INTRAVENOUS
  Administered 2015-10-09 (×2): 15 mg/h via INTRAVENOUS
  Administered 2015-10-09: 10 mg/h via INTRAVENOUS
  Filled 2015-10-07 (×9): qty 100

## 2015-10-07 MED ORDER — KETOROLAC TROMETHAMINE 15 MG/ML IJ SOLN: 15.0000 mg | Freq: Three times a day (TID) | INTRAMUSCULAR | Status: DC | PRN

## 2015-10-07 MED ORDER — SODIUM CHLORIDE 0.45 % IV SOLN
INTRAVENOUS | Status: DC
  Administered 2015-10-07 – 2015-10-09 (×4): via INTRAVENOUS

## 2015-10-07 MED ORDER — FUROSEMIDE 10 MG/ML IJ SOLN
40.0000 mg | Freq: Once | INTRAMUSCULAR | Status: AC
  Administered 2015-10-07: 40 mg via INTRAVENOUS
  Filled 2015-10-07: qty 4

## 2015-10-07 MED ORDER — DILTIAZEM LOAD VIA INFUSION
10.0000 mg | Freq: Once | INTRAVENOUS | Status: AC
  Administered 2015-10-07: 10 mg via INTRAVENOUS
  Filled 2015-10-07: qty 10

## 2015-10-07 MED ORDER — PIPERACILLIN-TAZOBACTAM 3.375 G IVPB
3.3750 g | Freq: Three times a day (TID) | INTRAVENOUS | Status: DC
  Administered 2015-10-07 – 2015-10-09 (×9): 3.375 g via INTRAVENOUS
  Filled 2015-10-07 (×11): qty 50

## 2015-10-07 MED ORDER — ACETAMINOPHEN 325 MG PO TABS
650.0000 mg | ORAL_TABLET | ORAL | Status: DC | PRN
  Administered 2015-10-07: 650 mg via ORAL
  Filled 2015-10-07: qty 2

## 2015-10-07 MED ORDER — MORPHINE SULFATE (PF) 2 MG/ML IV SOLN
1.0000 mg | INTRAVENOUS | Status: DC | PRN
Start: 2015-10-07 — End: 2015-10-08

## 2015-10-07 NOTE — Progress Notes (Signed)
Patient began complaining of SOB. RN assessed patient and SpO2 78% on 3L nasal cannula. O2 changed to 5L/min and patient's sats went up to 92-95%. Kneecaps noted to be mottled, which is new for the patient. HR still in the 130s-140s after 10mg  IV cardizem given. RRT called and MD paged to bedside. Per MD patient to be transferred to ICU/CCU Room 2 and started on a cardizem drip. Will call in report as soon as possible and will notify patient's daughter. Nursing staff will continue to monitor. Lamonte RicherKara A Dayzee Trower, RN

## 2015-10-07 NOTE — Consult Note (Signed)
ANTICOAGULATION CONSULT NOTE - Follow Up  Pharmacy Consult for Warfarin dosing  Indication: atrial fibrillation  Allergies  Allergen Reactions  . Cetirizine Other (See Comments)    Causes patient to go in AFib.   Patient Measurements: Height: 5\' 9"  (175.3 cm) Weight: 151 lb 14.4 oz (68.9 kg) IBW/kg (Calculated) : 70.7  Vital Signs: Temp: 97.7 F (36.5 C) (11/27 0156) Temp Source: Axillary (11/27 0156) BP: 102/69 mmHg (11/27 0700) Pulse Rate: 85 (11/27 0700)  Labs:  Recent Labs  30-Jun-2015 1140 10/05/15 0634 10/06/15 0304 10/07/15 0550  HGB 16.1 14.2  --  15.0  HCT 51.2 44.6  --  47.0  PLT 138* 98*  --  98*  LABPROT 21.0* 19.9* 23.8* 25.8*  INR 1.82 1.69 2.15 2.39  CREATININE 1.17 1.02 0.99  --   TROPONINI 0.06*  --   --   --     Estimated Creatinine Clearance: 59 mL/min (by C-G formula based on Cr of 0.99).   Medical History: Past Medical History  Diagnosis Date  . Dementia   . Diabetes mellitus without complication (HCC)   . Hypertension     Medications:  Scheduled:  . antiseptic oral rinse  7 mL Mouth Rinse BID  . diltiazem  240 mg Oral Daily  . divalproex  125 mg Oral Q12H  . donepezil  10 mg Oral QHS  . insulin aspart  0-9 Units Subcutaneous TID WC  . piperacillin-tazobactam (ZOSYN)  IV  3.375 g Intravenous 3 times per day  . warfarin  2.5 mg Oral q1800  . Warfarin - Pharmacist Dosing Inpatient   Does not apply q1800   Infusions:  . sodium chloride 75 mL/hr at 10/07/15 0600  . diltiazem (CARDIZEM) infusion 15 mg/hr (10/07/15 0600)    Assessment: 81 yo male with mechanical aortic valve and A. Fib with RVR. Unclear if patient was actually taking warfarin at home, his outpatient pharmacy says he has not had it filled since July.  INR at admission on 11/24  1.82. 5mg  given and INR was 1.69 on 11/25. 11/25  Dose held due to initiation of amiodarone drip.  11/26 INR   2.15 11/27 INR   2.39  Goal of Therapy:  INR 2-3   Plan:  Will continue  patient on warfarin 2.5 mg q1800.   INR ordered for 0500 AM tomorrow.   Pharmacy will continue to monitor and adjust per consult.    Stormy CardKatsoudas,Denelda Akerley K, [facility] MilwaukeeRPH Clinical Pharmacist  10/07/2015 8:08 AM

## 2015-10-07 NOTE — Consult Note (Signed)
  Pt seen in ICU - 1 [facility]. Pt is a 81 yr old white male with H/O Dementia and is a poor historian. Staff report that he has been agitated once and was given Haldol which helped him calm down, O Pt was seen laying in bed with o2. Calm, No agitation. Confused and is drowsy. Further mental status could not be done. I/J Impaired. Imp Dementia with confusion. Rec Continue Haldol one mg and Ativan one mg po or IM q 6 hrs prn for agitation and after he is stable and awake request for follow up with Psychiatry. Pt has apt coming up with Neurology after discharge from Hospital.

## 2015-10-07 NOTE — Progress Notes (Signed)
[facility] - [place] at [facility]   PATIENT NAME: Russell Fernandez    MR#:  [id number]  DATE OF BIRTH:  [birth year]  SUBJECTIVE: Patient moved out of ICU yesterday afternoon but his condition deteriorated with atrial fibrillation with RVR, hypoxia. Started back on Cardizem drip. O2 sats. Also 78% on 2 L last night so rapid response was called. Patient immediately moved to intensive care unit, chest x-ray repeated. This morning he is very confused trying to pull out of the line so sitter is at the bedside. On Cardizem 10 mg/h heart rate is around 110. On 2 L of oxygen saturations are 98%.  CHIEF COMPLAINT:   Chief Complaint  Patient presents with  . Altered Mental Status    REVIEW OF SYSTEMS:   Review of Systems  Unable to perform ROS: acuity of condition   C DRUG ALLERGIES:   Allergies  Allergen Reactions  . Cetirizine Other (See Comments)    Causes patient to go in AFib.    VITALS:  Blood pressure 112/66, pulse 104, temperature 97.7 F (36.5 C), temperature source Axillary, resp. rate 23, height  (1.753 m), weight 68.9 kg (151 lb 14.4 oz), SpO2 100 %.  PHYSICAL EXAMINATION:  GENERAL:  81 y.o.-year-old patient lying in the bed with no acute distress.  EYES: Pupils equal, round, reactive to light and accommodation. No scleral icterus. Extraocular muscles intact.  HEENT: Head atraumatic, normocephalic. Oropharynx and nasopharynx clear.  NECK:  Supple, no jugular venous distention. No thyroid enlargement, no tenderness.  LUNGS: Normal breath sounds bilaterally, no wheezing, rales,rhonchi or crepitation. No use of accessory muscles of respiration.  CARDIOVASCULAR: S1, S2 normal. No murmurs, rubs, or gallops. . ABDOMEN: Soft, nontender, nondistended. Bowel sounds present. No organomegaly or mass.  EXTREMITIES: No pedal edema, cyanosis, or clubbing.  NEUROLOGIC: Unable to assess for neurological exam secondary to confusion.Marland Kitchen  PSYCHIATRIC:not  oriented to  time, place, person.  SKIN: No obvious rash, lesion, or ulcer.    LABORATORY PANEL:   CBC  Recent Labs Lab 10/07/15 0550  WBC 12.7*  HGB 15.0  HCT 47.0  PLT 98*   ------------------------------------------------------------------------------------------------------------------  Chemistries   Recent Labs Lab 10/02/2015 1140 10/05/15 0634 10/06/15 0304  NA 141 141 142  K 4.8 3.9 4.2  CL 104 109 110  CO2 GLUCOSE 154* 118* 141*  BUN 47* 40* 43*  CREATININE 1.17 1.02 0.99  CALCIUM 9.4 8.5* 8.6*  MG 2.0  --   --   AST 133* 88*  --   ALT 94* 89*  --   ALKPHOS 48 37*  --   BILITOT 2.3* 1.4*  --    ------------------------------------------------------------------------------------------------------------------  Cardiac Enzymes  Recent Labs Lab 09/26/2015 1140  TROPONINI 0.06*   ------------------------------------------------------------------------------------------------------------------  RADIOLOGY:  Dg Chest 1 View  10/07/2015  CLINICAL DATA:  Shortness of breath. EXAM: CHEST 1 VIEW COMPARISON:  09/25/2015 FINDINGS: Postoperative changes in the mediastinum. Cardiac enlargement with mild vascular congestion. Bilateral upper lobe infiltrates are increasing since previous study. Small bilateral pleural effusions with basilar atelectasis, also increasing. Calcified and tortuous aorta. IMPRESSION: Increasing bilateral upper lobe infiltrates, increasing bilateral pleural effusions, and increasing atelectasis in the lung bases. Cardiac enlargement with mild pulmonary vascular congestion. Electronically Signed   By: Burman Nieves M.D.   On: 10/07/2015 02:34    EKG:   Orders placed or performed during the hospital encounter of 09/21/2015  . ED EKG  . ED EKG  . EKG 12-Lead  .  EKG 12-Lead    ASSESSMENT AND PLAN:   #1.  Hypoxia and respiratory failure secondary to healthcare associated pneumonia: Continue Vanco and Zosyn. Patient antibiotics were discontinued  yesterday morning but chest x-ray concerning for pneumonia in the bases. So restarted on antibiotics.  was on Ventimask briefly. 2 L saturating 97%. #2 chronic atrial fibrillation with RVR: Consulted cardiology, the amiodarone drip secondary to persistent tachycardia: Restarted Cardizem drip.  Does have delirium due to pneumonia and also dementia: Psychiatric is following with Haldol and Ativan as needed.  #3 history of mechanical aortic valve.; continue coumadin  4/dementia with memory problems:with at the delirium secondary to pneumonia, atrial fibrillation: Continue Haldol and Ativan as needed. Psychiatrically follow up appreciated. We'll consult palliative care tomorrow. She has advanced dementia and not able to care for self. Not able to follow commands as well. Head ct on admission unremarkable.. New sitter at this time secondary to agitation and  Pulling lines  (5 . Nutrition: Speech evaluation  Suggested NPO due to his  Lethargy./continue IV fluids Consider palliative care, .   6 ,agitation and confusion; continue sitter at bedside and use when necessary Haldol.   All the records are reviewed and case discussed with Care Management/Social Workerr. Management plans discussed with the patient, family and they are in agreement.  CODE STATUS: DO NOT RESUSCITATE  TOTAL TIME TAKING CARE OF THIS PATIENT: 40 minutes.Critical careE  D/C IN 5-6 DAYS, DEPENDING ON CLINICAL CONDITION.   Katha HammingKONIDENA,Nykayla Marcelli M.D on 10/07/2015 at 12:21 PM  Between 7am  6pm - Pager - [phone]  After 6pm go to [email or web address] - password [email or web address]  [facility] [place] [facility]  Office  [phone]  CC: Primary care physician; Dennison MascotLemont Morrisey, MD   Note: This dictation was prepared with Dragon dictation along with smaller phrase technology. Any transcriptional errors that result from this process are unintentional.

## 2015-10-07 NOTE — Progress Notes (Signed)
eLink Physician-Brief Progress Note Patient Name: Russell Fernandez DOB: [birth year] MRN: [id number]   Date of Service  10/07/2015  HPI/Events of Note  81 yr old male with pneumonia asked to evaluate for elevated HR.  Patient seen by cardiology today and presently on cardizem drip.  He was poorly responsive at time of my evaluation after having received haldol for agitation.  O2 sat was decreased on [place].  He was placed on mask with improvement in saturation.  HR presently 120's.  eICU Interventions  ABG now No change in cardizem Titrate FIO2 to keep sat greater than 92%     Intervention Category Intermediate Interventions: Arrhythmia - evaluation and management  [name], Joshu Furukawa, P 10/07/2015, 3:19 AM

## 2015-10-07 NOTE — Progress Notes (Signed)
Slept on and off throughout shift. Figity at times and restless at times. Haldol given once PRN for agitation. Sitter at bedside. Afib per cardiac monitor and on cardizem drip.  VSS. Patient more responsive as day progressed, conversing with sitter more.  Alert to self only. Alert and following some commands this evening and able to swallow pills.

## 2015-10-07 NOTE — Progress Notes (Signed)
[facility] Patient currently on 14L on venturi mask. Will get patient ready for transport. Lamonte RicherKara A Kvon Mcilhenny, RN

## 2015-10-07 NOTE — Progress Notes (Signed)
Called due to patient afib RVR not controlled by amiodarone drip.  Cardizem push given, transient effect.  Patient respiratory status declined.  Initial admission thought was CHF vs PNA, then felt not to be PNA as pt WBC improved.  Transfer to ICU with d/c amio drip and start cardizem gtt.  Repeat CXR STAT now to re-evaluate.  Also reported by nursing pt has mottling of knees, unlikely embolus as INR is therapeutic on coumadin.  Distal extremities warm.  Kristeen MissWILLIS, Tonilynn Bieker FIELDING [facility] 10/07/2015, 1:35 AM

## 2015-10-07 NOTE — Progress Notes (Signed)
Pt received as transfer from telemetry. Changed from venturi to 4L per nasal cannula. Elink notified. Cardizem drip started.

## 2015-10-07 NOTE — Progress Notes (Signed)
ANTIBIOTIC CONSULT NOTE - INITIAL  Pharmacy Consult for Zosyn Indication: pneumonia  Allergies  Allergen Reactions  . Cetirizine Other (See Comments)    Causes patient to go in AFib.    Patient Measurements: Height: 5\' 9"  (175.3 cm) Weight: 151 lb 14.4 oz (68.9 kg) IBW/kg (Calculated) : 70.7 Adjusted Body Weight:   Vital Signs: Temp: 97.7 F (36.5 C) (11/27 0156) Temp Source: Axillary (11/27 0156) BP: 115/100 mmHg (11/27 0223) Pulse Rate: 134 (11/27 0223) Intake/Output from previous day: 11/26 0701 - 11/27 0700 In: 2302.5 [I.V.:2002.5] Out: 300 [Urine:300] Intake/Output from this shift: Total I/O In: 627.5 [I.V.:627.5] Out: -   Labs:  Recent Labs  09/13/2015 1140 10/05/15 0634 10/06/15 0304  WBC 13.0* 11.6*  --   HGB 16.1 14.2  --   PLT 138* 98*  --   CREATININE 1.17 1.02 0.99   Estimated Creatinine Clearance: 59 mL/min (by C-G formula based on Cr of 0.99). No results for input(s): VANCOTROUGH, VANCOPEAK, VANCORANDOM, GENTTROUGH, GENTPEAK, GENTRANDOM, TOBRATROUGH, TOBRAPEAK, TOBRARND, AMIKACINPEAK, AMIKACINTROU, AMIKACIN in the last 72 hours.   Microbiology: Recent Results (from the past 720 hour(s))  Culture, blood (routine x 2)     Status: None (Preliminary result)   Collection Time: 10/01/2015  3:15 PM  Result Value Ref Range Status   Specimen Description BLOOD LEFT ARM  Final   Special Requests BOTTLES DRAWN AEROBIC AND ANAEROBIC 7CC  Final   Culture NO GROWTH 2 DAYS  Final   Report Status PENDING  Incomplete  Culture, blood (routine x 2)     Status: None (Preliminary result)   Collection Time: 09/23/2015  3:23 PM  Result Value Ref Range Status   Specimen Description BLOOD LEFT ARM  Final   Special Requests BOTTLES DRAWN AEROBIC AND ANAEROBIC 6CC  Final   Culture NO GROWTH 2 DAYS  Final   Report Status PENDING  Incomplete  MRSA PCR Screening     Status: None   Collection Time: 09/24/2015  7:31 PM  Result Value Ref Range Status   MRSA by PCR NEGATIVE  NEGATIVE Final    Comment:        The GeneXpert MRSA Assay (FDA approved for NASAL specimens only), is one component of a comprehensive MRSA colonization surveillance program. It is not intended to diagnose MRSA infection nor to guide or monitor treatment for MRSA infections.     Medical History: Past Medical History  Diagnosis Date  . Dementia   . Diabetes mellitus without complication (HCC)   . Hypertension     Medications:  Infusions:  . sodium chloride 75 mL/hr at 10/07/15 0222  . diltiazem (CARDIZEM) infusion 15 mg/hr (10/07/15 0246)   Assessment: 81 yom initially admitted for CHF vs PNA was on ceftriaxone/azithromycin, abx discontinued. Now restarting for concern of HCAP/declining respiratory status unresponsive to iv push diltiazem x 1 and amiodarone drip.   Goal of Therapy:    Plan:  Expected duration 7 days with resolution of temperature and/or normalization of WBC. Zosyn 3.375 gm IV Q8H EI.  Carola FrostNathan A Daleisa Halperin, Pharm.D., BCPS Clinical Pharmacist 10/07/2015,2:55 AM

## 2015-10-07 NOTE — Progress Notes (Signed)
Patient transferred to CCU successfully. Called and left a voice message for patient's daughter to update her on what happened. Lamonte RicherKara A Charles Niese, RN

## 2015-10-07 NOTE — Progress Notes (Signed)
Pt has 1:1 sitter at bedside. Resting well since haldol administered.

## 2015-10-08 DIAGNOSIS — I248 Other forms of acute ischemic heart disease: Secondary | ICD-10-CM

## 2015-10-08 DIAGNOSIS — Z794 Long term (current) use of insulin: Secondary | ICD-10-CM

## 2015-10-08 DIAGNOSIS — Z66 Do not resuscitate: Secondary | ICD-10-CM

## 2015-10-08 DIAGNOSIS — D72829 Elevated white blood cell count, unspecified: Secondary | ICD-10-CM

## 2015-10-08 DIAGNOSIS — E119 Type 2 diabetes mellitus without complications: Secondary | ICD-10-CM

## 2015-10-08 DIAGNOSIS — Z515 Encounter for palliative care: Secondary | ICD-10-CM

## 2015-10-08 DIAGNOSIS — Z7901 Long term (current) use of anticoagulants: Secondary | ICD-10-CM

## 2015-10-08 DIAGNOSIS — I10 Essential (primary) hypertension: Secondary | ICD-10-CM

## 2015-10-08 DIAGNOSIS — R1310 Dysphagia, unspecified: Secondary | ICD-10-CM

## 2015-10-08 DIAGNOSIS — I4891 Unspecified atrial fibrillation: Secondary | ICD-10-CM

## 2015-10-08 DIAGNOSIS — E860 Dehydration: Secondary | ICD-10-CM

## 2015-10-08 DIAGNOSIS — I429 Cardiomyopathy, unspecified: Secondary | ICD-10-CM

## 2015-10-08 DIAGNOSIS — E46 Unspecified protein-calorie malnutrition: Secondary | ICD-10-CM

## 2015-10-08 DIAGNOSIS — Z79899 Other long term (current) drug therapy: Secondary | ICD-10-CM

## 2015-10-08 DIAGNOSIS — G9341 Metabolic encephalopathy: Secondary | ICD-10-CM

## 2015-10-08 DIAGNOSIS — I081 Rheumatic disorders of both mitral and tricuspid valves: Secondary | ICD-10-CM

## 2015-10-08 DIAGNOSIS — J189 Pneumonia, unspecified organism: Secondary | ICD-10-CM

## 2015-10-08 DIAGNOSIS — F0390 Unspecified dementia without behavioral disturbance: Secondary | ICD-10-CM

## 2015-10-08 DIAGNOSIS — I5022 Chronic systolic (congestive) heart failure: Secondary | ICD-10-CM

## 2015-10-08 LAB — BASIC METABOLIC PANEL
ANION GAP: 11 (ref 5–15)
BUN: 33 mg/dL — ABNORMAL HIGH (ref 6–20)
CALCIUM: 8.8 mg/dL — AB (ref 8.9–10.3)
CHLORIDE: 110 mmol/L (ref 101–111)
CO2: 23 mmol/L (ref 22–32)
CREATININE: 1.02 mg/dL (ref 0.61–1.24)
GFR calc non Af Amer: >60 mL/min (ref >60)
Glucose, Bld: 120 mg/dL — ABNORMAL HIGH (ref 65–99)
Potassium: 3.7 mmol/L (ref 3.5–5.1)
SODIUM: 144 mmol/L (ref 135–145)

## 2015-10-08 LAB — GLUCOSE, CAPILLARY
Glucose-Capillary: 107 mg/dL — ABNORMAL HIGH (ref 65–99)
Glucose-Capillary: 107 mg/dL — ABNORMAL HIGH (ref 65–99)
Glucose-Capillary: 135 mg/dL — ABNORMAL HIGH (ref 65–99)
Glucose-Capillary: 168 mg/dL — ABNORMAL HIGH (ref 65–99)

## 2015-10-08 LAB — PROTIME-INR
INR: 3.5
PROTHROMBIN TIME: 34.4 s — AB (ref 11.4–15.0)

## 2015-10-08 MED ORDER — BISACODYL 10 MG RE SUPP: 10.0000 mg | Freq: Every day | RECTAL | Status: DC | PRN

## 2015-10-08 MED ORDER — ONDANSETRON HCL 4 MG/2ML IJ SOLN: 4.0000 mg | Freq: Four times a day (QID) | INTRAMUSCULAR | Status: DC | PRN

## 2015-10-08 NOTE — Consult Note (Signed)
ANTICOAGULATION CONSULT NOTE - Follow Up  Pharmacy Consult for Warfarin dosing  Indication: atrial fibrillation  Allergies  Allergen Reactions  . Cetirizine Other (See Comments)    Causes patient to go in AFib.   Patient Measurements: Height: 5\' 9"  (175.3 cm) Weight: 151 lb 14.4 oz (68.9 kg) IBW/kg (Calculated) : 70.7  Vital Signs: BP: 98/68 mmHg (11/28 0700) Pulse Rate: 73 (11/28 0702)  Recent Labs  10/06/15 0304 10/07/15 0550 10/08/15 0555  HGB  --  15.0  --   HCT  --  47.0  --   PLT  --  98*  --   LABPROT 23.8* 25.8* 34.4*  INR 2.15 2.39 3.50  CREATININE 0.99  --  1.02   Estimated Creatinine Clearance: 57.2 mL/min (by C-G formula based on Cr of 1.02).  Medical History: Past Medical History  Diagnosis Date  . Dementia   . Diabetes mellitus without complication (HCC)   . Hypertension    Medications:  Scheduled:  . antiseptic oral rinse  7 mL Mouth Rinse BID  . diltiazem  240 mg Oral Daily  . divalproex  125 mg Oral Q12H  . donepezil  10 mg Oral QHS  . insulin aspart  0-9 Units Subcutaneous TID WC  . piperacillin-tazobactam (ZOSYN)  IV  3.375 g Intravenous 3 times per day  . Warfarin - Pharmacist Dosing Inpatient   Does not apply q1800   Infusions:  . sodium chloride 75 mL/hr at 10/08/15 0227  . diltiazem (CARDIZEM) infusion 10 mg/hr (10/08/15 [id number])    Assessment: 81 yo male with mechanical aortic valve and A. Fib with RVR. Unclear if patient was actually taking warfarin at home, his outpatient pharmacy says he has not had it filled since July.  INR at admission on 11/24  1.82. 5mg  given and INR was 1.69 on 11/25. 11/25  Dose held due to initiation of amiodarone drip.  11/26  INR   2.15 Started warfarin 2.5mg  q1800 11/27  INR   2.39 11/28  INR   3.5  Goal of Therapy:  INR 2-3   Plan:  Due to supratherapeutic INR of 3.5 will hold warfarin dose today.   INR ordered for 0500 AM tomorrow.  Pharmacy will continue to monitor and adjust per consult.     Cher NakaiSheema Tyrese Capriotti, PharmD Pharmacy Resident 10/08/2015 7:46 AM

## 2015-10-08 NOTE — Progress Notes (Addendum)
According to monitor, patient had a 10-beat run of V-tach. Patient's condition did not change during run of v-tach. He continues to be lethargic in bed and has returned to a-fib with HR in low 100s. Cardiology made aware and stated he is receiving appropriate treatment at this time. Will continue to monitoring for any change in condition and will notify MDs as warranted.

## 2015-10-08 NOTE — Progress Notes (Addendum)
   10/08/15 1300  Clinical Encounter Type  Visited With Patient;Health care provider  Visit Type Initial  Consult/Referral To Chaplain  Spiritual Encounters  Spiritual Needs Emotional  Stress Factors  Patient Stress Factors Not reviewed  Chaplain rounded in the unit and offered a compassionate presence and support as applicable. Chaplain Jon Lall A. Eric Morganti Ext. [phone]

## 2015-10-08 NOTE — Evaluation (Signed)
Clinical/Bedside Swallow Evaluation Patient Details  Name: Russell Fernandez MRN: [id number] Date of Birth: [birth year]  Today's Date: 10/08/2015 Time: SLP Start Time (ACUTE ONLY): 1300 SLP Stop Time (ACUTE ONLY): 1330 SLP Time Calculation (min) (ACUTE ONLY): 30 min  Past Medical History:  Past Medical History  Diagnosis Date  . Dementia   . Diabetes mellitus without complication (HCC)   . Hypertension    Past Surgical History:  Past Surgical History  Procedure Laterality Date  . Aortic valve replacement     HPI:  Russell Fernandez is a 81 y.o. male with a known history of dementia, diabetes, hypertension, aortic valve replacement with mechanical valve- lives alone at home his wife died in 2016-07-23and since then gradually he is getting worse and he is also under investigation by primary care and the urologist for his worsening dementia. His daughter who is power of attorney lives in [place] and has been insisting him to get to assisted living place or to rehabilitation but he denies for that he is able to leave independent life with help of daughter to help with financial things and neighbors taking him out if he wanted to go somewhere but otherwise at home he does not need any support to walk.   Assessment / Plan / Recommendation Clinical Impression  Pt presented with oral pharyngeal phase dysphagia c/b suspected delayed swallow and minimally prolonged A-P transit time and suspected multiple swallows. Pt initially was not demonstrated awarness to task, but as ST continued to feed him and provide visual/verbal/tactile cues Pt became more aware to task. Suspect oral and pharyngeal phase deficits are related to declined cognitive status and known history of dementia. Pt did not exhibit any overt or immediate s/s of aspiration across all trials. Pt consumed 4 ounces of honey thick liquid and 2 ounces of applesauce. Pt requires max cuing. At one point, pt apeared to doze off with a mouthful off honey  thickened apple juice in his mouth and required ST to explicitly tell him to open his eyes and swallow. Pt conversant and responded appropriately to ST in conversation. Pt would benefit from a diet of purees with honey thick liquids by tsp and with strict aspiration precautions. NSG updated.    Aspiration Risk  Moderate aspiration risk (due to declined cognitive status)    Diet Recommendation   Puree with Honey thick liquids (use tsps only, strict aspiration precautions)  Medication Administration: Crushed with puree (as able)    Other  Recommendations Oral Care Recommendations: Oral care BID;Staff/trained caregiver to provide oral care   Follow up Recommendations   (TBD)    Frequency and Duration min 3x week  1 week       Prognosis Prognosis for Safe Diet Advancement: Guarded Barriers to Reach Goals: Cognitive deficits;Severity of deficits;Behavior      Swallow Study   General Date of Onset: 10/05/2015 HPI: Russell Fernandez is a 81 y.o. male with a known history of dementia, diabetes, hypertension, aortic valve replacement with mechanical valve- lives alone at home his wife died in 2016-07-23and since then gradually he is getting worse and he is also under investigation by primary care and the urologist for his worsening dementia. His daughter who is power of attorney lives in [place] and has been insisting him to get to assisted living place or to rehabilitation but he denies for that he is able to leave independent life with help of daughter to help with financial things and neighbors taking him out  if he wanted to go somewhere but otherwise at home he does not need any support to walk. Type of Study: Bedside Swallow Evaluation Diet Prior to this Study:  (unknown, suspect regular with thins as pt lives at home) Temperature Spikes Noted: No Respiratory Status: Nasal cannula (2-3 liters) History of Recent Intubation: No Behavior/Cognition: Cooperative;Pleasant mood (awake, intermittent  awareness to task) Oral Cavity Assessment: Within Functional Limits Oral Care Completed by SLP: No Oral Cavity - Dentition: Edentulous Self-Feeding Abilities: Needs set up;Total assist Patient Positioning: Upright in bed Baseline Vocal Quality: Normal Volitional Cough: Weak Volitional Swallow: Able to elicit    Oral/Motor/Sensory Function Overall Oral Motor/Sensory Function: Within functional limits   Ice Chips Ice chips: Not tested   Thin Liquid Thin Liquid: Not tested    Nectar Thick Nectar Thick Liquid: Not tested   Honey Thick Honey Thick Liquid: Impaired Presentation: Spoon Oral Phase Impairments:  (reduced oral awareness to task initially) Oral Phase Functional Implications: Prolonged oral transit Pharyngeal Phase Impairments: Suspected delayed Swallow;Multiple swallows Other Comments: Pt consumed 4 ounces of honey thickened applejuice with no immediate or overt s/s of aspiration.    Puree Puree: Impaired Presentation: Spoon Oral Phase Functional Implications: Prolonged oral transit Pharyngeal Phase Impairments: Suspected delayed Swallow;Multiple swallows Other Comments: Pt consumed ~2 ounces of applesauce with no immediate or overt s/s of aspiration.   Solid Solid: Not tested       Rilee Knoll 10/08/2015,1:48 PM

## 2015-10-08 NOTE — Consult Note (Signed)
Palliative Medicine Inpatient Consult Note   Name: Russell Fernandez Date: 10/08/2015 MRN: [id number]  DOB: 04-05-1933  Referring Physician: Katha Hamming, MD  Palliative Care consult requested for this 81 y.o. male for goals of medical therapy in patient with dementia, pneumonia, and AFib RVR.     TODAY'S DISCUSSIONS AND DECISIONS: 1.  Pt is DNR.  I have completed a portable DNR form for the paper chart.  2.  Apparently, he is to be DNI also based on progress note referencing a discussion between Dr Elisabeth Pigeon and the patient's daughter at time of admission on 15-Oct-2015.   3.  Pt is not currently able to make his own healthcare decisions due to an apparent metabolic encephalopathy related to critical illness and also partly related to his dementia.  He was living alone prior to this hospital stay, but it isn't known if this was appropriate or not. He shows evidence of self-neglect with very long curved onchomycotic toenails --so long this would have interfered with the wearing of socks and especially shoes.  We do not know his current cognitive baseline or degree of dementia since he was living alone and did not have in-home observers of his functional status or mentation.  Currently, he cannot make decisions for himself.  4.  There is only one daughter listed and she has only one phone listed.  I called that one number and left a message for her to call back.  I would like to discuss goals of care --the kinds of things that would show up on a MOST form.  There is no great urgency about this, as patient is likely to continue to get aggressive care for his AFib RVR and pneumonia.  HOWEVER, he is not necessarily getting better, and if he fails to improve, we could end up talking about Hospice Home.  I would expect some improvement medically soon, and then we would likely be talking about a rehab stay WITH PALLIATIVE CARE involved at a SNF.  Going home should not be an option w/o a full time  in-home care-giver.   5.  The patient has had significant delerium at night, as dementia patients will often experience.  Speech therapy asked me if we could talk about giving him less so he might wake up at mealtimes, etc.  I encouraged her to discuss with attending.  At the present time, I do not see any active orders for prn Haldol or Ativan.  Perhaps low doses should be ordered so that stronger doses do not get ordered in the middle of a behavior crisis in the middle of the night.  I will be glad to assist with symptom management, but the consult was for goals of care, so will defer to attending on this matter at this time.    6.  The patient has limited life-expectancy due to his cardiomyopathy with EF 20-25%.  He would qualify for Hospice if that was desired. It is not that he is imminently dying, but he does have a likelihood of not being alive in 6 months time.      IMPRESSION: AFIB w/ RVR ---on  HTN Mechanical Aortic Valve  ---on coumadin but subtherapeutic Pneumonia ---Community Acquired ---getting Rocephin and Azithromycin  Dehydration ---getting IV fluids Cardiomyopathy --presumably ischemic in nature --associated with chronic systolic CHF --with EF only 20-25% --with mod MR and Mod TR also AFIB RVR ---on IV Cardizem Aortic Valve Replacement  --mechanical Episodes of VTach Dementia --stage unknown HTN DM-2 Dysphagia ---  now to be on pureed with Honey-thick Malnutrition--moderate --present on admission Elevated Troponin labs ---due to demand ischemia Leukocytosis   REVIEW OF SYSTEMS:  Patient is not able to provide ROS due to dementia.   SPIRITUAL SUPPORT SYSTEM: Yes --family.  SOCIAL HISTORY:  reports that he has never smoked. He has never used smokeless tobacco. He reports that he does not drink alcohol or use illicit drugs.  LEGAL DOCUMENTS:  I have placed a portable DNR form in the paper chart.     CODE STATUS: DNR  PAST MEDICAL HISTORY: Past  Medical History  Diagnosis Date  . Dementia   . Diabetes mellitus without complication (HCC)   . Hypertension     PAST SURGICAL HISTORY:  Past Surgical History  Procedure Laterality Date  . Aortic valve replacement      ALLERGIES:  is allergic to cetirizine.  MEDICATIONS:  Current Facility-Administered Medications  Medication Dose Route Frequency Provider Last Rate Last Dose  . 0.45 % sodium chloride infusion   Intravenous Continuous Katha Hamming, MD 75 mL/hr at 10/08/15 0227    . acetaminophen (TYLENOL) tablet 650 mg  650 mg Oral Q4H PRN Adrian Saran, MD   650 mg at 10/07/15 1903  . antiseptic oral rinse (CPC / CETYLPYRIDINIUM CHLORIDE 0.05%) solution 7 mL  7 mL Mouth Rinse BID Altamese Dilling, MD   7 mL at 10/08/15 1000  . diltiazem (CARDIZEM CD) 24 hr capsule 240 mg  240 mg Oral Daily Lamar Blinks, MD   240 mg at 10/05/15 1340  . diltiazem (CARDIZEM) 100 mg in dextrose 5 % 100 mL (1 mg/mL) infusion  5-15 mg/hr Intravenous Continuous Oralia Manis, MD 12.5 mL/hr at 10/08/15 0934 12.5 mg/hr at 10/08/15 0934  . divalproex (DEPAKOTE SPRINKLE) capsule 125 mg  125 mg Oral Q12H Katha Hamming, MD   125 mg at 10/05/15 1604  . donepezil (ARICEPT) tablet 10 mg  10 mg Oral QHS Altamese Dilling, MD   10 mg at 09/25/2015 2153  . insulin aspart (novoLOG) injection 0-9 Units  0-9 Units Subcutaneous TID WC Altamese Dilling, MD   1 Units at 10/07/15 1115  . ketorolac (TORADOL) 15 MG/ML injection 15 mg  15 mg Intravenous Q8H PRN Adrian Saran, MD      . piperacillin-tazobactam (ZOSYN) IVPB 3.375 g  3.375 g Intravenous 3 times per day Oralia Manis, MD   3.375 g at 10/08/15 0953  . Warfarin - Pharmacist Dosing Inpatient   Does not apply q1800 Bertram Savin, [facility]        Vital Signs: BP 122/79 mmHg  Pulse 101  Temp(Src) 97.6 F (36.4 C) (Axillary)  Resp 20  Ht  (1.753 m)  Wt 68.9 kg (151 lb 14.4 oz)  BMI 22.42 kg/m2  SpO2 93% Filed Weights   09/13/2015 1135  10/07/15 0122  Weight: 72 kg (158 lb 11.7 oz) 68.9 kg (151 lb 14.4 oz)    Estimated body mass index is 22.42 kg/(m^2) as calculated from the following:   Height as of this encounter:  (1.753 m).   Weight as of this encounter: 68.9 kg (151 lb 14.4 oz).  PERFORMANCE STATUS (ECOG) : 4 - Bedbound currently, but normally independent with ambulation  PHYSICAL EXAM: Pt is sleeping now in ICU bed with O2 being delivered via [place] Head of bed is up Just got finished working with ST Bilateral temporal wasting is noted Eyes are closed Nares are patent Lips are moist appearing Neck w/o JVD or TM Hrt  irreg irreg no m Lungs with ronchi and rales in bases ant.  Abd soft and NT Ext no cyanosis or mottling.   He is lethargic from sedating meds given last night (which were needed due to agitated delerium).   LABS: CBC:    Component Value Date/Time   WBC 12.7* 10/07/2015 0550   WBC 8.5 02/18/2014 0151   HGB 15.0 10/07/2015 0550   HGB 13.2 02/18/2014 0151   HCT 47.0 10/07/2015 0550   HCT 41.4 02/18/2014 0151   PLT 98* 10/07/2015 0550   PLT 127* 02/19/2014 0536   MCV 90.6 10/07/2015 0550   MCV 92 02/18/2014 0151   NEUTROABS 10.7* Nov 01, 2015 1140   NEUTROABS 6.0 02/18/2014 0151   LYMPHSABS 0.9* Nov 01, 2015 1140   LYMPHSABS 1.7 02/18/2014 0151   MONOABS 1.3* Nov 01, 2015 1140   MONOABS 0.7 02/18/2014 0151   EOSABS 0.0 Nov 01, 2015 1140   EOSABS 0.1 02/18/2014 0151   BASOSABS 0.0 Nov 01, 2015 1140   BASOSABS 0.0 02/18/2014 0151   Comprehensive Metabolic Panel:    Component Value Date/Time   NA 144 10/08/2015 0555   NA 135* 02/19/2014 0536   K 3.7 10/08/2015 0555   K 4.1 02/19/2014 0536   CL 110 10/08/2015 0555   CL 101 02/19/2014 0536   CO2 23 10/08/2015 0555   CO2 31 02/19/2014 0536   BUN 33* 10/08/2015 0555   BUN 14 02/19/2014 0536   CREATININE 1.02 10/08/2015 0555   CREATININE 0.82 02/19/2014 0536   GLUCOSE 120* 10/08/2015 0555   GLUCOSE 120* 02/19/2014 0536   CALCIUM 8.8*  10/08/2015 0555   CALCIUM 8.6 02/19/2014 0536   AST 88* 10/05/2015 0634   AST 29 02/17/2014 1111   ALT 89* 10/05/2015 0634   ALT 29 02/17/2014 1111   ALKPHOS 37* 10/05/2015 0634   ALKPHOS 60 02/17/2014 1111   BILITOT 1.4* 10/05/2015 0634   BILITOT 0.6 02/17/2014 1111   PROT 5.1* 10/05/2015 0634   PROT 6.8 02/17/2014 1111   ALBUMIN 2.8* 10/05/2015 0634   ALBUMIN 3.7 02/17/2014 1111   TESTS:  CT HEAD 01/21/15: Global atrophy. There is no mass effect, midline shift, or acute hemorrhage. Mastoid air cells clear. Minimal mucous layering in the right maxillary sinus is not significantly changed.No acute intracranial pathology.   CXR 10/07/15: Increasing bilateral upper lobe infiltrates, increasing bilateral pleural effusions, and increasing atelectasis in the lung bases. Cardiac enlargement with mild pulmonary vascular congestion.  ECHO 10/05/15: - Left ventricle: The cavity size was severely dilated. Systolic function was severely reduced. The estimated ejection fraction was in the range of 20% to 25%. Diffuse hypokinesis. - Aortic valve: A prosthesis was present and functioning normally. The prosthesis had a normal range of motion. The sewing ring appeared normal, had no rocking motion, and showed no evidence of dehiscence. - Mitral valve: There was moderate regurgitation. - Left atrium: The atrium was moderately dilated. - Right atrium: The atrium was mildly dilated. - Tricuspid valve: There was moderate regurgitation.     More than 50% of the visit was spent in counseling/coordination of care: Yes  Time Spent:  80 minutes

## 2015-10-08 NOTE — Progress Notes (Signed)
ANTIBIOTIC CONSULT NOTE - INITIAL  Pharmacy Consult for Zosyn Indication: pneumonia  Allergies  Allergen Reactions  . Cetirizine Other (See Comments)    Causes patient to go in AFib.    Patient Measurements: Height: 5\' 9"  (175.3 cm) Weight: 151 lb 14.4 oz (68.9 kg) IBW/kg (Calculated) : 70.7  Vital Signs: Temp: 97.6 F (36.4 C) (11/28 1147) Temp Source: Axillary (11/28 1147) BP: 122/79 mmHg (11/28 1230) Pulse Rate: 101 (11/28 1230) Intake/Output from previous day: 11/27 0701 - 11/28 0700 In: 4211 [I.V.:4061; IV Piggyback:150] Out: 2300 [Urine:2300] Intake/Output from this shift: Total I/O In: 498.6 [I.V.:448.6; IV Piggyback:50] Out: 825 [Urine:825]  Labs:  Recent Labs  10/06/15 0304 10/07/15 0550 10/08/15 0555  WBC  --  12.7*  --   HGB  --  15.0  --   PLT  --  98*  --   CREATININE 0.99  --  1.02   Estimated Creatinine Clearance: 57.2 mL/min (by C-G formula based on Cr of 1.02). No results for input(s): VANCOTROUGH, VANCOPEAK, VANCORANDOM, GENTTROUGH, GENTPEAK, GENTRANDOM, TOBRATROUGH, TOBRAPEAK, TOBRARND, AMIKACINPEAK, AMIKACINTROU, AMIKACIN in the last 72 hours.   Microbiology: Recent Results (from the past 720 hour(s))  Culture, blood (routine x 2)     Status: None (Preliminary result)   Collection Time: 03/24/15  3:15 PM  Result Value Ref Range Status   Specimen Description BLOOD LEFT ARM  Final   Special Requests BOTTLES DRAWN AEROBIC AND ANAEROBIC 7CC  Final   Culture NO GROWTH 4 DAYS  Final   Report Status PENDING  Incomplete  Culture, blood (routine x 2)     Status: None (Preliminary result)   Collection Time: 03/24/15  3:23 PM  Result Value Ref Range Status   Specimen Description BLOOD LEFT ARM  Final   Special Requests BOTTLES DRAWN AEROBIC AND ANAEROBIC 6CC  Final   Culture NO GROWTH 4 DAYS  Final   Report Status PENDING  Incomplete  MRSA PCR Screening     Status: None   Collection Time: 03/24/15  7:31 PM  Result Value Ref Range Status   MRSA  by PCR NEGATIVE NEGATIVE Final    Comment:        The GeneXpert MRSA Assay (FDA approved for NASAL specimens only), is one component of a comprehensive MRSA colonization surveillance program. It is not intended to diagnose MRSA infection nor to guide or monitor treatment for MRSA infections.     Medical History: Past Medical History  Diagnosis Date  . Dementia   . Diabetes mellitus without complication (HCC)   . Hypertension    Medications:  Infusions:  . sodium chloride 75 mL/hr at 10/08/15 0227  . diltiazem (CARDIZEM) infusion 12.5 mg/hr (10/08/15 0934)   Assessment: 81 yo male initially admitted for CHF vs PNA was on ceftriaxone/azithromycin, abx discontinued. Now restarting for concern of HCAP/declining respiratory status unresponsive to iv push diltiazem x 1 and amiodarone drip. Pharmacy consulted to dose and monitor Zosyn.   Plan:  Will continue  Zosyn 3.375 gm IV Q8H EI. Pharmacy will continue to monitor labs and renal function and make adjustments as needed.   Cher NakaiSheema Wendell Fiebig, PharmD Pharmacy Resident 10/08/2015 1:09 PM

## 2015-10-08 NOTE — Progress Notes (Signed)
Patient emergency contact: [phone], daughter Shelle IronMelissa Ricks. Melissa lives in [place].  She will be available to talk to Dr. Orvan Falconerampbell on 09/29/15 between 9am-3pm.

## 2015-10-08 NOTE — Care Management (Signed)
Attempted to meet with patient regarding discharge planning; sitter at bedside. Patient is not able to provide meaningful information as he appears to be confused. Apparently he is from home alone and his daughter does not live in [place]. PT evaluation would be helpful with discharge. Coumadin is listed on MAR so PT/INRs will be needing at discharge if this Rx continues. CSW consult placed for discharge planning assistance. No family present at this time. Left my contact information along with my contact card. RNCM will continue to follow.

## 2015-10-08 NOTE — Progress Notes (Signed)
[facility] Encounter Note  Patient: BRYDON SPAHR / Admit Date: 2015-11-02 / Date of Encounter: 10/08/2015, 7:47 AM   Subjective: Patient is not responsive to commands and is significantly demented. Patient does have reasonable heart rate control of atrial fibrillation with current medical regimen although not taking oral medications at this time and will need to continue to venous treatment  Review of Systems: Positive for: None due to dementia   Objective: Telemetry: Atrial fibrillation with controlled ventricular rate Physical Exam: Blood pressure 98/68, pulse 73, temperature 97.8 F (36.6 C), temperature source Axillary, resp. rate 15, height  (1.753 m), weight 151 lb 14.4 oz (68.9 kg), SpO2 99 %. Body mass index is 22.42 kg/(m^2). General: Well developed, well nourished, in no acute distress. Head: Normocephalic, atraumatic, sclera non-icteric, no xanthomas, nares are without discharge. Neck: No apparent masses Lungs: Normal respirations with no wheezes, upper rhonchi, no rales , diffuse crackles   Heart: Irregular rate and rhythm, normal S1 S2, no murmur, no rub, no gallop, PMI is normal size and placement, carotid upstroke normal without bruit, jugular venous pressure normal Abdomen: Soft, non-tender, non-distended with normoactive bowel sounds. No hepatosplenomegaly. Abdominal aorta is normal size without bruit Extremities: Trace edema, no clubbing, no cyanosis, no ulcers,  Peripheral: 2+ radial, 2+ femoral, 2+ dorsal pedal pulses Neuro: Not Alert and oriented. Moves all extremities spontaneously. Psych:  Patient does not Responds to questions appropriately with a normal affect.   Intake/Output Summary (Last 24 hours) at 10/08/15 0747 Last data filed at 10/08/15 0536  Gross per 24 hour  Intake   4196 ml  Output   2300 ml  Net   1896 ml    Inpatient Medications:  . antiseptic oral rinse  7 mL Mouth Rinse BID  . diltiazem  240 mg Oral Daily  .  divalproex  125 mg Oral Q12H  . donepezil  10 mg Oral QHS  . insulin aspart  0-9 Units Subcutaneous TID WC  . piperacillin-tazobactam (ZOSYN)  IV  3.375 g Intravenous 3 times per day  . Warfarin - Pharmacist Dosing Inpatient   Does not apply q1800   Infusions:  . sodium chloride 75 mL/hr at 10/08/15 0227  . diltiazem (CARDIZEM) infusion 10 mg/hr (10/08/15 0614)    Labs:  Recent Labs  10/06/15 0304 10/08/15 0555  NA 142 144  K 4.2 3.7  CL 110 110  CO2 24 23  GLUCOSE 141* 120*  BUN 43* 33*  CREATININE 0.99 1.02  CALCIUM 8.6* 8.8*   No results for input(s): AST, ALT, ALKPHOS, BILITOT, PROT, ALBUMIN in the last 72 hours.  Recent Labs  10/07/15 0550  WBC 12.7*  HGB 15.0  HCT 47.0  MCV 90.6  PLT 98*   No results for input(s): CKTOTAL, CKMB, TROPONINI in the last 72 hours. Invalid input(s): POCBNP No results for input(s): HGBA1C in the last 72 hours.   Weights: Filed Weights   11-02-15 1135 10/07/15 0122  Weight: 158 lb 11.7 oz (72 kg) 151 lb 14.4 oz (68.9 kg)     Radiology/Studies:  Dg Chest 1 View  10/07/2015  CLINICAL DATA:  Shortness of breath. EXAM: CHEST 1 VIEW COMPARISON:  11-02-15 FINDINGS: Postoperative changes in the mediastinum. Cardiac enlargement with mild vascular congestion. Bilateral upper lobe infiltrates are increasing since previous study. Small bilateral pleural effusions with basilar atelectasis, also increasing. Calcified and tortuous aorta. IMPRESSION: Increasing bilateral upper lobe infiltrates, increasing bilateral pleural effusions, and increasing atelectasis in the lung bases. Cardiac  enlargement with mild pulmonary vascular congestion. Electronically Signed   By: Burman NievesWilliam  Stevens M.D.   On: 10/07/2015 02:34   Ct Head Wo Contrast  10/03/2015  CLINICAL DATA:  Stroke EXAM: CT HEAD WITHOUT CONTRAST TECHNIQUE: Contiguous axial images were obtained from the base of the skull through the vertex without intravenous contrast. COMPARISON:   08/16/2012 FINDINGS: Global atrophy. There is no mass effect, midline shift, or acute hemorrhage. Mastoid air cells clear. Minimal mucous layering in the right maxillary sinus is not significantly changed. IMPRESSION: No acute intracranial pathology. Electronically Signed   By: Jolaine ClickArthur  Hoss M.D.   On: 09/21/2015 12:02   Dg Chest Port 1 View  10/09/2015  CLINICAL DATA:  Tachycardia, altered mental status, dementia. EXAM: PORTABLE CHEST 1 VIEW COMPARISON:  Chest x-rays dated 02/17/2014 and 04/08/2013. FINDINGS: Mild cardiomegaly is unchanged. Overall cardiomediastinal silhouette is stable in size and configuration. Median sternotomy wires appear intact and stable in alignment. There are new airspace opacities within the upper lobes bilaterally, with perhaps mild central pulmonary vascular congestion. Again noted is chronic elevation of the left hemidiaphragm with probable overlying atelectasis. Right lung base is relatively clear. IMPRESSION: New airspace opacities within the upper lobes bilaterally. This may indicate congestive heart failure with asymmetric pulmonary edema. Multifocal pneumonia is certainly not excluded, especially if febrile. Interstitial fibrosis is less likely given the normal appearance of these areas on previous exam. Given the biapical distribution, tuberculosis is also a consideration. Cardiomegaly, stable. Elevation of the left hemidiaphragm, stable. Electronically Signed   By: Bary RichardStan  Maynard M.D.   On: 09/29/2015 11:54     Assessment and Recommendation  81 y.o. male with diabetes with complication essential hypertension mixed hyperlipidemia status post aortic valve replacement and chronic nonvalvular atrial fibrillation with rapid ventricular rate secondary to acute pneumonia and infection now more controlled with intravenous medication management although patient is not responsive and having difficulty with addition of oral medications for heart rate control 1. Continue amiodarone  for possible spontaneous conversion to normal sinus rhythm 2. Possible addition of Cardizem orally if patient will take medications for heart rate control 3. Continue treatment of the pneumonia and infection exacerbating above 4. No further cardiac diagnostics necessary at this time 5. Anticoagulation for further risk reduction in stroke with atrial fibrillation  Signed, Arnoldo HookerBruce Neveen Daponte M.D. FACC

## 2015-10-08 NOTE — Progress Notes (Signed)
[facility] [place] at [facility]   PATIENT NAME: Russell Fernandez    MR#:  [id number]  DATE OF BIRTH:  Jan 02, 1933  SUBJECTIVE;confused,agitated . 10 beat run  Of v tach.  CHIEF COMPLAINT:   Chief Complaint  Patient presents with  . Altered Mental Status    REVIEW OF SYSTEMS:   Review of Systems  Unable to perform ROS: acuity of condition   C DRUG ALLERGIES:   Allergies  Allergen Reactions  . Cetirizine Other (See Comments)    Causes patient to go in AFib.    VITALS:  Blood pressure 122/79, pulse 101, temperature 97.6 F (36.4 C), temperature source Axillary, resp. rate 20, height 5\' 9"  (1.753 m), weight 68.9 kg (151 lb 14.4 oz), SpO2 93 %.  PHYSICAL EXAMINATION:  GENERAL:  81 y.o.-year-old patient lying in the bed with no acute distress.  EYES: Pupils equal, round, reactive to light and accommodation. No scleral icterus. Extraocular muscles intact.  HEENT: Head atraumatic, normocephalic. Oropharynx and nasopharynx clear.  NECK:  Supple, no jugular venous distention. No thyroid enlargement, no tenderness.  LUNGS: Normal breath sounds bilaterally, no wheezing, rales,rhonchi or crepitation. No use of accessory muscles of respiration.  CARDIOVASCULAR: S1, S2 normal. No murmurs, rubs, or gallops. . ABDOMEN: Soft, nontender, nondistended. Bowel sounds present. No organomegaly or mass.  EXTREMITIES: No pedal edema, cyanosis, or clubbing.  NEUROLOGIC: Unable to assess for neurological exam secondary to confusion.Marland Kitchen.  PSYCHIATRIC:not  oriented to time, place, person.  SKIN: No obvious rash, lesion, or ulcer.    LABORATORY PANEL:   CBC  Recent Labs Lab 10/07/15 0550  WBC 12.7*  HGB 15.0  HCT 47.0  PLT 98*   ------------------------------------------------------------------------------------------------------------------  Chemistries   Recent Labs Lab 09/25/2015 1140 10/05/15 0634  10/08/15 0555  NA 141 141  < > 144  K 4.8 3.9  < > 3.7  CL  104 109  < > 110  CO2 25 26  < > 23  GLUCOSE 154* 118*  < > 120*  BUN 47* 40*  < > 33*  CREATININE 1.17 1.02  < > 1.02  CALCIUM 9.4 8.5*  < > 8.8*  MG 2.0  --   --   --   AST 133* 88*  --   --   ALT 94* 89*  --   --   ALKPHOS 48 37*  --   --   BILITOT 2.3* 1.4*  --   --   < > = values in this interval not displayed. ------------------------------------------------------------------------------------------------------------------  Cardiac Enzymes  Recent Labs Lab 09/29/2015 1140  TROPONINI 0.06*   ------------------------------------------------------------------------------------------------------------------  RADIOLOGY:  Dg Chest 1 View  10/07/2015  CLINICAL DATA:  Shortness of breath. EXAM: CHEST 1 VIEW COMPARISON:  09/28/2015 FINDINGS: Postoperative changes in the mediastinum. Cardiac enlargement with mild vascular congestion. Bilateral upper lobe infiltrates are increasing since previous study. Small bilateral pleural effusions with basilar atelectasis, also increasing. Calcified and tortuous aorta. IMPRESSION: Increasing bilateral upper lobe infiltrates, increasing bilateral pleural effusions, and increasing atelectasis in the lung bases. Cardiac enlargement with mild pulmonary vascular congestion. Electronically Signed   By: Burman NievesWilliam  Stevens M.D.   On: 10/07/2015 02:34    EKG:   Orders placed or performed during the hospital encounter of 10/05/2015  . ED EKG  . ED EKG  . EKG 12-Lead  . EKG 12-Lead    ASSESSMENT AND PLAN:   #1.  Hypoxia and respiratory failure secondary to healthcare associated pneumonia: Continue Vanco and Zosyn.  Patient antibiotics were discontinued yesterday morning but chest x-ray concerning for pneumonia in the bases. So restarted on antibiotics.  was on Ventimask briefly. 2 L saturating 97%. #2 chronic atrial fibrillation with RVR: Consulted cardiology, the amiodarone drip secondary to persistent tachycardia: Restarted Cardizem drip.  Does have  delirium due to pneumonia and also dementia: Psychiatric is following w #3 history of mechanical aortic valve.; continue coumadin  4/dementia with memory problems:with at the delirium secondary to pneumonia, atrial fibrillation:  She has advanced dementia and not able to care for self. Not able to follow commands as well. Head ct on admission unremarkable.. New sitter at this time secondary to agitation and  Pulling lines. DC Haldol, morphine, Ativan to see if becomes more alert.  (5 . Nutrition: Speech evaluation  Suggested NPO due to his  Lethargy./continue IV fluids Consider palliative care, .   6 ,agitation and confusion; continue sitter at bedside   All the records are reviewed and case discussed with Care Management/Social Workerr. Management plans discussed with the patient, family and they are in agreement.  CODE STATUS: DO NOT RESUSCITATE  TOTAL TIME TAKING CARE OF THIS PATIENT: 40 minutes.Critical careE  D/C IN 5-6 DAYS, DEPENDING ON CLINICAL CONDITION.   Katha Hamming M.D on 10/08/2015 at 12:47 PM  Between 7am  6pm - Pager - [phone]  After 6pm go to [email or web address] - password [email or web address]  [facility]  Office  [phone]  CC: Primary care physician; Dennison Mascot, MD   Note: This dictation was prepared with Dragon dictation along with smaller phrase technology. Any transcriptional errors that result from this process are unintentional.

## 2015-10-09 DIAGNOSIS — E440 Moderate protein-calorie malnutrition: Secondary | ICD-10-CM | POA: Insufficient documentation

## 2015-10-09 LAB — PROTIME-INR
INR: 2.74
Prothrombin Time: 28.6 seconds — ABNORMAL HIGH (ref 11.4–15.0)

## 2015-10-09 LAB — GLUCOSE, CAPILLARY
GLUCOSE-CAPILLARY: 143 mg/dL — AB (ref 65–99)
Glucose-Capillary: 162 mg/dL — ABNORMAL HIGH (ref 65–99)

## 2015-10-09 MED ORDER — SODIUM CHLORIDE 0.9 % IV BOLUS (SEPSIS)
1000.0000 mL | Freq: Once | INTRAVENOUS | Status: AC
  Administered 2015-10-09: 1000 mL via INTRAVENOUS

## 2015-10-09 MED ORDER — METOPROLOL TARTRATE 25 MG PO TABS
25.0000 mg | ORAL_TABLET | Freq: Two times a day (BID) | ORAL | Status: DC
  Administered 2015-10-09: 25 mg via ORAL
  Filled 2015-10-09 (×2): qty 1

## 2015-10-09 MED ORDER — WARFARIN SODIUM 1 MG PO TABS
2.0000 mg | ORAL_TABLET | Freq: Once | ORAL | Status: AC
  Administered 2015-10-09: 2 mg via ORAL
  Filled 2015-10-09: qty 2

## 2015-10-09 MED ORDER — PHENYLEPHRINE HCL 10 MG/ML IJ SOLN
30.0000 ug/min | INTRAVENOUS | Status: DC
  Administered 2015-10-09: 50 ug/min via INTRAVENOUS
  Filled 2015-10-09 (×2): qty 1

## 2015-10-09 NOTE — Clinical Social Work Note (Signed)
Clinical Social Worker is continuing to follow for discharge planning needs. CSW was reconsulted for SNF. Pt needs PT consult for evaluation for disposition. CSW will continue to follow.   Dede QuerySarah Vasiliy Mccarry, MSW, LCSW Clinical Social Worker [phone]

## 2015-10-09 NOTE — Progress Notes (Signed)
[facility] [place] at [facility]   PATIENT NAME: Russell Fernandez    MR#:  [id number]  DATE OF BIRTH:  11-10-1933  SUBJECTIVE; less agitated today. Had breakfast today. On Cardizem drip 10 mg/h heart rate is around 110.  patient denies chest pain, shortness of breath, cough. Sitter at bedside.  CHIEF COMPLAINT:   Chief Complaint  Patient presents with  . Altered Mental Status    REVIEW OF SYSTEMS:   Review of Systems  Unable to perform ROS: acuity of condition   C DRUG ALLERGIES:   Allergies  Allergen Reactions  . Cetirizine Other (See Comments)    Causes patient to go in AFib.    VITALS:  Blood pressure 119/77, pulse 39, temperature 98.1 F (36.7 C), temperature source Oral, resp. rate 32, height  (1.753 m), weight 68.9 kg (151 lb 14.4 oz), SpO2 95 %.  PHYSICAL EXAMINATION:  GENERAL:  81 y.o.-year-old patient lying in the bed with no acute distress.  EYES: Pupils equal, round, reactive to light and accommodation. No scleral icterus. Extraocular muscles intact.  HEENT: Head atraumatic, normocephalic. Oropharynx and nasopharynx clear.  NECK:  Supple, no jugular venous distention. No thyroid enlargement, no tenderness.  LUNGS: Normal breath sounds bilaterally, no wheezing, rales,rhonchi or crepitation. No use of accessory muscles of respiration.  CARDIOVASCULAR: S1, S2 normal. No murmurs, rubs, or gallops. . ABDOMEN: Soft, nontender, nondistended. Bowel sounds present. No organomegaly or mass.  EXTREMITIES: No pedal edema, cyanosis, or clubbing.  NEUROLOGIC: Unable to assess for neurological exam secondary to confusion.Marland Kitchen  PSYCHIATRIC:not  oriented to time, place, person.  SKIN: No obvious rash, lesion, or ulcer.    LABORATORY PANEL:   CBC  Recent Labs Lab 10/07/15 0550  WBC 12.7*  HGB 15.0  HCT 47.0  PLT 98*    ------------------------------------------------------------------------------------------------------------------  Chemistries   Recent Labs Lab 10/05/2015 1140 10/05/15 0634  10/08/15 0555  NA 141 141  < > 144  K 4.8 3.9  < > 3.7  CL 104 109  < > 110  CO2 25 26  < > 23  GLUCOSE 154* 118*  < > 120*  BUN 47* 40*  < > 33*  CREATININE 1.17 1.02  < > 1.02  CALCIUM 9.4 8.5*  < > 8.8*  MG 2.0  --   --   --   AST 133* 88*  --   --   ALT 94* 89*  --   --   ALKPHOS 48 37*  --   --   BILITOT 2.3* 1.4*  --   --   < > = values in this interval not displayed. ------------------------------------------------------------------------------------------------------------------  Cardiac Enzymes  Recent Labs Lab 10/09/2015 1140  TROPONINI 0.06*   ------------------------------------------------------------------------------------------------------------------  RADIOLOGY:  No results found.  EKG:   Orders placed or performed during the hospital encounter of 09/21/2015  . ED EKG  . ED EKG  . EKG 12-Lead  . EKG 12-Lead    ASSESSMENT AND PLAN:   #1.  Hypoxia and respiratory failure secondary to healthcare associated pneumonia: Continue Vanco and Zosyn. #2 chronic atrial fibrillation with RVR: Consulted cardiology, ; continue Cardizem drip, Cardizem CD 240 mg daily, add metoprolol 25 mg twice a day and wean off Cardizem drip if tolerated.  Does have delirium due to pneumonia and also dementia: Psychiatric is following w #3 history of mechanical aortic valve.; continue coumadin  4/dementia with memory problems:with at the delirium secondary to pneumonia, atrial fibrillation:  She has advanced dementia  and not able to care for self. Not able to follow commands as well. Head ct on admission unremarkable.. New sitter at this time secondary to agitation and  Pulling lines. Appreciate   Palliative care input., daughter is willing to talk with palliative care physician. (5 . Nutrition: Speech  evaluation  started on pure. Diet with honey thick liquids, patient tolerating that well..   6 ,agitation and confusion; continue sitter at bedside   All the records are reviewed and case discussed with Care Management/Social Workerr. Management plans discussed with the patient, family and they are in agreement.  CODE STATUS: DO NOT RESUSCITATE  TOTAL TIME TAKING CARE OF THIS PATIENT: 40 minutes.Critical careE  D/C IN 5-6 DAYS, DEPENDING ON CLINICAL CONDITION.   Katha HammingKONIDENA,Sondos Wolfman M.D on 10/09/2015 at 12:53 PM  Between 7am  6pm - Pager - [phone]  After 6pm go to [email or web address] - password [email or web address]  [facility]  Office  [phone]  CC: Primary care physician; Dennison MascotLemont Morrisey, MD   Note: This dictation was prepared with Dragon dictation along with smaller phrase technology. Any transcriptional errors that result from this process are unintentional.

## 2015-10-09 NOTE — Progress Notes (Signed)
Speech Language Pathology Treatment: Dysphagia  Patient Details Name: Russell Fernandez MRN: [id number] DOB: 05/23/1933 Today's Date: 10/09/2015 Time: [id number] SLP Time Calculation (min) (ACUTE ONLY): 35 min  Assessment / Plan / Recommendation Clinical Impression  Pt appears to be tolerating the currently ordered diet of Dys. 1 w/ Honey consistency liquids w/ no overt s/s of aspiration noted; no decline in respiratory status during intake. Pt requires full feeding assistance sec. to declined Cognitive status. His declined Cognitive and medical status' also impact is overall awareness and safety w/ po intake at this time and increase his risk for aspiration. Rec. Continue w/ current diet as ordered w/ strict aspiration precautions and feeding at all meals; meds in puree w/ NSG  Continued assessment of toleration of diet; trials to upgrade consistency as tolerates to ensure safety w/ po's.     HPI HPI: Russell Fernandez is a 81 y.o. male with a known history of dementia, diabetes, hypertension, aortic valve replacement with mechanical valve- lives alone at home his wife died in June 2016 and since then gradually he is getting worse and he is also under investigation by primary care and the urologist for his worsening dementia. His daughter who is power of attorney lives in [place] and has been insisting him to get to assisted living place or to rehabilitation but he denies for that he is able to live independent life with help. Pt is currently less verbal, confused, and does not follow directions. He required max. cues to follow through w/ tasks of taking po's. Intermittent responses noted; not pertaining to context of situation often. NSG reported pt has been tolerating current Dys. 1 w/ Honey consistency liquids diet.       SLP Plan  Continue with current plan of care     Recommendations  Diet recommendations: Dysphagia 1 (puree);Honey-thick liquid Liquids provided via: Cup;Teaspoon Medication  Administration: Crushed with puree Supervision: Staff to assist with self feeding;Full supervision/cueing for compensatory strategies;Trained caregiver to feed patient Compensations: Minimize environmental distractions;Slow rate;Small sips/bites;Follow solids with liquid Postural Changes and/or Swallow Maneuvers: Seated upright 90 degrees              General recommendations:  (palliative care consult) Oral Care Recommendations: Oral care BID;Staff/trained caregiver to provide oral care Follow up Recommendations: Skilled Nursing facility (TBD) Plan: Continue with current plan of care   Jerilynn SomKatherine Watson, MS, CCC-SLP  Watson,Katherine 10/09/2015, 1:58 PM

## 2015-10-09 NOTE — Consult Note (Signed)
ANTICOAGULATION CONSULT NOTE - Follow Up  Pharmacy Consult for Warfarin dosing  Indication: atrial fibrillation  Allergies  Allergen Reactions  . Cetirizine Other (See Comments)    Causes patient to go in AFib.   Patient Measurements: Height: 5\' 9"  (175.3 cm) Weight: 151 lb 14.4 oz (68.9 kg) IBW/kg (Calculated) : 70.7  Vital Signs: Temp: 98.1 F (36.7 C) (11/29 0700) Temp Source: Oral (11/29 0700) BP: 128/91 mmHg (11/29 0700) Pulse Rate: 119 (11/29 0700)  Recent Labs  10/07/15 0550 10/08/15 0555 10/09/15 0354  HGB 15.0  --   --   HCT 47.0  --   --   PLT 98*  --   --   LABPROT 25.8* 34.4* 28.6*  INR 2.39 3.50 2.74  CREATININE  --  1.02  --    Estimated Creatinine Clearance: 57.2 mL/min (by C-G formula based on Cr of 1.02).  Medical History: Past Medical History  Diagnosis Date  . Dementia   . Diabetes mellitus without complication (HCC)   . Hypertension    Medications:  Scheduled:  . antiseptic oral rinse  7 mL Mouth Rinse BID  . diltiazem  240 mg Oral Daily  . divalproex  125 mg Oral Q12H  . donepezil  10 mg Oral QHS  . insulin aspart  0-9 Units Subcutaneous TID WC  . piperacillin-tazobactam (ZOSYN)  IV  3.375 g Intravenous 3 times per day  . Warfarin - Pharmacist Dosing Inpatient   Does not apply q1800   Infusions:  . sodium chloride 75 mL/hr at 10/09/15 0332  . diltiazem (CARDIZEM) infusion 15 mg/hr (10/09/15 [id number])    Assessment: 81 yo male with mechanical aortic valve and A. Fib with RVR. Unclear if patient was actually taking warfarin at home, his outpatient pharmacy says he has not had it filled since July.  INR at admission on 11/24  1.82. 5mg  given and INR was 1.69 on 11/25. 11/25  Dose held due to initiation of amiodarone drip.  11/26  INR   2.15 Started warfarin 2.5mg  q1800 11/27  INR   2.39 11/28  INR   3.5 11/29  INR  2.74  Goal of Therapy:  INR 2-3   Plan:  Warfarin level today is therapeutic. Will resume warfarin tonight at 1800 at  2mg .  INR ordered for 0500 AM tomorrow.  Pharmacy will continue to monitor and adjust per consult.    Cher NakaiSheema Nicklos Gaxiola, PharmD Pharmacy Resident 10/09/2015 7:39 AM

## 2015-10-09 NOTE — Progress Notes (Addendum)
eLink Physician-Brief Progress Note Patient Name: Russell HoltsHal F Kantner DOB: 1933/03/22 MRN: [id number]   Date of Service  10/09/2015  HPI/Events of Note  Hypotension - BP = 55/41.   eICU Interventions  Will order: 1. ABG now. 2. 0.9 NaCl 1 liter IV over 1 hour now.  3. Phenylephrine IV infusion. Titrate to MAP >= 65.      Intervention Category Major Interventions: Hypotension - evaluation and management  Sommer,Steven Eugene 10/09/2015, 10:54 PM

## 2015-10-09 NOTE — Progress Notes (Signed)
ANTIBIOTIC CONSULT NOTE - INITIAL  Pharmacy Consult for Zosyn Indication: pneumonia  Allergies  Allergen Reactions  . Cetirizine Other (See Comments)    Causes patient to go in AFib.    Patient Measurements: Height: 5\' 9"  (175.3 cm) Weight: 151 lb 14.4 oz (68.9 kg) IBW/kg (Calculated) : 70.7  Vital Signs: Temp: 98.1 F (36.7 C) (11/29 0700) Temp Source: Oral (11/29 0700) BP: 128/91 mmHg (11/29 0700) Pulse Rate: 119 (11/29 0700) Intake/Output from previous day: 11/28 0701 - 11/29 0700 In: 498.6 [I.V.:448.6; IV Piggyback:50] Out: 1625 [Urine:1625]  Recent Labs  10/07/15 0550 10/08/15 0555  WBC 12.7*  --   HGB 15.0  --   PLT 98*  --   CREATININE  --  1.02   Estimated Creatinine Clearance: 57.2 mL/min (by C-G formula based on Cr of 1.02).  Microbiology: Recent Results (from the past 720 hour(s))  Culture, blood (routine x 2)     Status: None (Preliminary result)   Collection Time: 12-17-14  3:15 PM  Result Value Ref Range Status   Specimen Description BLOOD LEFT ARM  Final   Special Requests BOTTLES DRAWN AEROBIC AND ANAEROBIC 7CC  Final   Culture NO GROWTH 4 DAYS  Final   Report Status PENDING  Incomplete  Culture, blood (routine x 2)     Status: None (Preliminary result)   Collection Time: 12-17-14  3:23 PM  Result Value Ref Range Status   Specimen Description BLOOD LEFT ARM  Final   Special Requests BOTTLES DRAWN AEROBIC AND ANAEROBIC 6CC  Final   Culture NO GROWTH 4 DAYS  Final   Report Status PENDING  Incomplete  MRSA PCR Screening     Status: None   Collection Time: 12-17-14  7:31 PM  Result Value Ref Range Status   MRSA by PCR NEGATIVE NEGATIVE Final    Comment:        The GeneXpert MRSA Assay (FDA approved for NASAL specimens only), is one component of a comprehensive MRSA colonization surveillance program. It is not intended to diagnose MRSA infection nor to guide or monitor treatment for MRSA infections.     Medical History: Past Medical  History  Diagnosis Date  . Dementia   . Diabetes mellitus without complication (HCC)   . Hypertension    Medications:  Infusions:  . sodium chloride 75 mL/hr at 10/09/15 0332  . diltiazem (CARDIZEM) infusion 15 mg/hr (10/09/15 [id number])   Assessment: 81 yo male initially admitted for CHF vs PNA was on ceftriaxone/azithromycin, abx discontinued. Now concern for HCAP/declining respiratory status. Pharmacy consulted to dose and monitor Zosyn.   Plan:  Will continue  Zosyn 3.375 gm IV Q8H EI.  Pharmacy will continue to monitor labs and renal function and make adjustments as needed.   Cher NakaiSheema Darran Gabay, PharmD Pharmacy Resident 10/09/2015 8:00 AM

## 2015-10-09 NOTE — Progress Notes (Signed)
[facility]  Patient: Russell HoltsHal F Apps / Admit Date: 09/21/2015 / Date of Encounter: 10/09/2015, 8:38 AM   Subjective: Patient is not responsive to commands and is significantly demented. Patient does have reasonable heart rate control of atrial fibrillation with current medical regimen and may be able to take oral medications at this time Review of Systems: Positive for: None due to dementia   Objective: Telemetry: Atrial fibrillation with controlled ventricular rate Physical Exam: Blood pressure 122/81, pulse 119, temperature 98.1 F (36.7 C), temperature source Oral, resp. rate 35, height 5\' 9"  (1.753 m), weight 151 lb 14.4 oz (68.9 kg), SpO2 99 %. Body mass index is 22.42 kg/(m^2). General: Well developed, well nourished, in no acute distress. Head: Normocephalic, atraumatic, sclera non-icteric, no xanthomas, nares are without discharge. Neck: No apparent masses Lungs: Normal respirations with no wheezes, upper rhonchi, no rales , diffuse crackles   Heart: Irregular rate and rhythm, normal S1 S2, no murmur, no rub, no gallop, PMI is normal size and placement, carotid upstroke normal without bruit, jugular venous pressure normal Abdomen: Soft, non-tender, non-distended with normoactive bowel sounds. No hepatosplenomegaly. Abdominal aorta is normal size without bruit Extremities: Trace edema, no clubbing, no cyanosis, no ulcers,  Peripheral: 2+ radial, 2+ femoral, 2+ dorsal pedal pulses Neuro: Not Alert and oriented. Moves all extremities spontaneously. Psych:  Patient does not Responds to questions appropriately with a normal affect.   Intake/Output Summary (Last 24 hours) at 10/09/15 0838 Last data filed at 10/09/15 0800  Gross per 24 hour  Intake 768.59 ml  Output   1025 ml  Net -256.41 ml    Inpatient Medications:  . antiseptic oral rinse  7 mL Mouth Rinse BID  . diltiazem  240 mg Oral Daily  . divalproex  125 mg Oral Q12H  . donepezil  10  mg Oral QHS  . insulin aspart  0-9 Units Subcutaneous TID WC  . piperacillin-tazobactam (ZOSYN)  IV  3.375 g Intravenous 3 times per day  . warfarin  2 mg Oral ONCE-1800  . Warfarin - Pharmacist Dosing Inpatient   Does not apply q1800   Infusions:  . sodium chloride 75 mL/hr at 10/09/15 0332  . diltiazem (CARDIZEM) infusion 15 mg/hr (10/09/15 0333)    Labs:  Recent Labs  10/08/15 0555  NA 144  K 3.7  CL 110  CO2 23  GLUCOSE 120*  BUN 33*  CREATININE 1.02  CALCIUM 8.8*   No results for input(s): AST, ALT, ALKPHOS, BILITOT, PROT, ALBUMIN in the last 72 hours.  Recent Labs  10/07/15 0550  WBC 12.7*  HGB 15.0  HCT 47.0  MCV 90.6  PLT 98*   No results for input(s): CKTOTAL, CKMB, TROPONINI in the last 72 hours. Invalid input(s): POCBNP No results for input(s): HGBA1C in the last 72 hours.   Weights: Filed Weights   09/25/2015 1135 10/07/15 0122  Weight: 158 lb 11.7 oz (72 kg) 151 lb 14.4 oz (68.9 kg)     Radiology/Studies:  Dg Chest 1 View  10/07/2015  CLINICAL DATA:  Shortness of breath. EXAM: CHEST 1 VIEW COMPARISON:  09/13/2015 FINDINGS: Postoperative changes in the mediastinum. Cardiac enlargement with mild vascular congestion. Bilateral upper lobe infiltrates are increasing since previous study. Small bilateral pleural effusions with basilar atelectasis, also increasing. Calcified and tortuous aorta. IMPRESSION: Increasing bilateral upper lobe infiltrates, increasing bilateral pleural effusions, and increasing atelectasis in the lung bases. Cardiac enlargement with mild pulmonary vascular congestion. Electronically Signed   By: Chrissie NoaWilliam  Andria Meuse M.D.   On: 10/07/2015 02:34   Ct Head Wo Contrast  October 13, 2015  CLINICAL DATA:  Stroke EXAM: CT HEAD WITHOUT CONTRAST TECHNIQUE: Contiguous axial images were obtained from the base of the skull through the vertex without intravenous contrast. COMPARISON:  08/16/2012 FINDINGS: Global atrophy. There is no mass effect, midline  shift, or acute hemorrhage. Mastoid air cells clear. Minimal mucous layering in the right maxillary sinus is not significantly changed. IMPRESSION: No acute intracranial pathology. Electronically Signed   By: Jolaine Click M.D.   On: 10-13-15 12:02   Dg Chest Port 1 View  2015/10/13  CLINICAL DATA:  Tachycardia, altered mental status, dementia. EXAM: PORTABLE CHEST 1 VIEW COMPARISON:  Chest x-rays dated 02/17/2014 and 04/08/2013. FINDINGS: Mild cardiomegaly is unchanged. Overall cardiomediastinal silhouette is stable in size and configuration. Median sternotomy wires appear intact and stable in alignment. There are new airspace opacities within the upper lobes bilaterally, with perhaps mild central pulmonary vascular congestion. Again noted is chronic elevation of the left hemidiaphragm with probable overlying atelectasis. Right lung base is relatively clear. IMPRESSION: New airspace opacities within the upper lobes bilaterally. This may indicate congestive heart failure with asymmetric pulmonary edema. Multifocal pneumonia is certainly not excluded, especially if febrile. Interstitial fibrosis is less likely given the normal appearance of these areas on previous exam. Given the biapical distribution, tuberculosis is also a consideration. Cardiomegaly, stable. Elevation of the left hemidiaphragm, stable. Electronically Signed   By: Bary Richard M.D.   On: 2015-10-13 11:54     Assessment and Recommendation  81 y.o. male with diabetes with complication essential hypertension mixed hyperlipidemia status post aortic valve replacement and chronic nonvalvular atrial fibrillation with rapid ventricular rate secondary to acute pneumonia and infection now more controlled with intravenous medication management although patient is not responsive and having difficulty with addition of oral medications for heart rate control 1. Continue amiodarone for possible spontaneous conversion to normal sinus rhythm and may be  able to change to oral dose of 400 mg each day if able 2. Continue Cardizem drip and changed to oral Cardizem 60 mg 3 times per day if necessary for heart rate control 3. Continue treatment of the pneumonia and infection exacerbating above 4. No further cardiac diagnostics necessary at this time 5. Anticoagulation for further risk reduction in stroke with atrial fibrillation  Signed, Arnoldo Hooker M.D. FACC

## 2015-10-09 NOTE — Progress Notes (Signed)
Called and spoke to patient's daughter, Efraim KaufmannMelissa, regarding decline in patient's condition.  Daughter verbalized understanding and said she plans to be here in the morning from [place]. Will keep updated on patient's status.

## 2015-10-09 NOTE — Progress Notes (Signed)
Initial Nutrition Assessment  DOCUMENTATION CODES:   Non-severe (moderate) malnutrition in context of chronic illness  INTERVENTION:   Medical Food Supplement Therapy: pt indicates he likes chocolate ice cream, will send Chocolate Magic Cup BID; will also order Honey Thick Mighty Shakes TID  NUTRITION DIAGNOSIS:   Malnutrition related to chronic illness, social / environmental circumstances, lethargy/confusion, dysphagia as evidenced by moderate depletions of muscle mass, moderate depletion of body fat.  GOAL:   Patient will meet greater than or equal to 90% of their needs  MONITOR:    (Energy Intake, Anthropometrics, Electrolyte/Renal Profile, Digestive System)  REASON FOR ASSESSMENT:    (Dysphagia Diet)    ASSESSMENT:    Pt admitted with AMS, hypoxia with respiratory failure due to pneumonia; pt with 1:1 sitter, wearing mittens, pt intermittently agitated/confused, dementia at baseline. Per report, pt had been living at home alone since wife died 07/05/2016progressive decline at home, has one daughter who does not live in [place].   Past Medical History  Diagnosis Date  . Dementia   . Diabetes mellitus without complication (HCC)   . Hypertension    Past Surgical History  Procedure Laterality Date  . Aortic valve replacement      Diet Order:  DIET - DYS 1 Room service appropriate?: Yes with Assist; Fluid consistency:: Honey Thick; SLP following  Energy Intake: pt had been NPO since admission (4 days) until yesterday after lunch; pt ate 15% at dinner last night. Ate well at breakfast this AM; per sitter, pt ate all of waffle, most of yogurt, some of cream of wheat and all of the thickened milk and juice with assistance  Food and nutrition related history: unable to assess  Digestive System: pt is edentulous  Skin:  Reviewed, no issues (no pressure ulcers documented)  Electrolyte and Renal Profile:  Recent Labs Lab 10-09-15 1140 10/05/15 0634 10/06/15 0304  10/08/15 0555  BUN 47* 40* 43* 33*  CREATININE 1.17 1.02 0.99 1.02  NA 141 141 142 144  K 4.8 3.9 4.2 3.7  MG 2.0  --   --   --   PHOS 4.9*  --   --   --    Glucose Profile:   Recent Labs  10/08/15 1615 10/08/15 2127 10/09/15 0710  GLUCAP 135* 168* 143*   Protein Profile:   Recent Labs Lab 2015-10-09 1140 10/05/15 0634  ALBUMIN 3.7 2.8*   Nutritional Anemia Profile:  CBC Latest Ref Rng 10/07/2015 10/05/2015 2015-10-09  WBC 3.8 - 10.6 K/uL 12.7(H) 11.6(H) 13.0(H)  Hemoglobin 13.0 - 18.0 g/dL 81.1 91.4 78.2  Hematocrit 40.0 - 52.0 % 47.0 44.6 51.2  Platelets 150 - 440 K/uL 98(L) 98(L) 138(L)    Meds: 1/2 NS at 75 ml/hr, ss novolog  Nutrition Focused Physical Exam: Nutrition-Focused physical exam completed. Findings are mild/moderate fat depletion, mild/moderate muscle depletion, and mild edema.    Height:   Ht Readings from Last 1 Encounters:  10/07/15  (1.753 m)    Weight: unable to assess changes in weight  Wt Readings from Last 1 Encounters:  10/07/15 151 lb 14.4 oz (68.9 kg)    Filed Weights   October 09, 2015 1135 10/07/15 0122  Weight: 158 lb 11.7 oz (72 kg) 151 lb 14.4 oz (68.9 kg)    BMI:  Body mass index is 22.42 kg/(m^2).  Estimated Nutritional Needs:   Kcal:  9562-1308 kcals (BEE 1389, 1.2 AF, 1.1-1.3 IF)   Protein:  76-90 g (1.1-1.3 g/kg)   Fluid:  1725-2070 mL (  25-30 ml/kg)      HIGH Care Level  Romelle Starcherate Rayette Mogg MS, RD, LDN [phone] Pager

## 2015-10-09 NOTE — Progress Notes (Signed)
Assessments remain unchanged from previous chartings, Dr Valinda HoarKonideni has made rounds and been updated on the patient's status, no contact from family. Cardizem drip continues at 5mg /hr, HR in the 80s. Mittens have been removed from patient due to not pulling. No ss of distress noted.

## 2015-10-09 NOTE — Clinical Social Work Note (Deleted)
Clinical Child psychotherapistocial Worker

## 2015-10-09 NOTE — Progress Notes (Signed)
Palliative Care Update   I came by and examined pt early today, but was not able to call his daughter between 3710 and 3 --the hours she said she would be available.  Will follow up and try to reach her tomorrow.    Suan HalterMargaret F Darlena Koval, MD

## 2015-10-10 LAB — GLUCOSE, CAPILLARY
GLUCOSE-CAPILLARY: 206 mg/dL — AB (ref 65–99)
Glucose-Capillary: 241 mg/dL — ABNORMAL HIGH (ref 65–99)

## 2015-10-10 LAB — CULTURE, BLOOD (ROUTINE X 2)
Culture: NO GROWTH
Culture: NO GROWTH

## 2015-10-11 NOTE — Progress Notes (Signed)
Patient expired at 0119 on Oct 14, 2015.  Dr Anne HahnWillis pronounced.  Called number on file for family and received no answer.  Left message on voicemail to contact hospital at earliest opportunity.  NeurosurgeonAdministrative coordinator notified.  [place] donor services notified.  Is not candidate as donor.

## 2015-10-11 NOTE — Discharge Summary (Signed)
[facility] [place] [facility]    Death Note - please see Last Note for all details.  PATIENT NAME: Russell Fernandez    MR#:  [id number]  DATE OF BIRTH:  [birth year]  DATE OF ADMISSION:  09/26/2015  PRIMARY CARE PHYSICIAN: Dennison MascotLemont Morrisey, MD   ADMISSION DIAGNOSIS:  Bilateral pneumonia [J18.9] Atrial fibrillation with rapid ventricular response (HCC) [I48.91]  HISTORY OF PRESENT ILLNESS ON ADMISSION (as per Dr Elisabeth PigeonVachhani):  Russell Fernandez is a 81 y.o. male with a known history of dementia, diabetes, hypertension, aortic valve replacement with mechanical valve- lives alone at home his wife died in June 2016 and since then gradually he is getting worse and he is also under investigation by primary care and the urologist for his worsening dementia. His daughter who is power of attorney lives in [place] and has been insisting him to get to assisted living place or to rehabilitation but he denies for that he is able to leave independent life with help of daughter to help with financial things and neighbors taking him out if he wanted to go somewhere but otherwise at home he does not need any support to walk. Today history obtained from patient's daughter, she said that for last few days he has been getting progressively weak and seems not to be taking his medications. Had more confusion and so brought to emergency room. He was noted to have bilateral upper lobe pneumonia on chest x-ray and his heart rate was running fast with A. fib so given Cardizem injection multiple times to control it but it was not successful so finally started on Cardizem IV drip. Daughter confirms patient's wishes to be DO NOT RESUSCITATE and not getting on ventilator machine in any adverse situation. In ER ER physician tried to taper off the Cardizem drip after giving oral tablet for 3-4 hours, but not successful so finally given to hospitalist team for possible admission to stepdown unit.  HOSPITAL COURSE:   Pt had difficulty controlling HR despite cardizem gtt.  Finally was controlled, but had persistent hypoxic episodes throughout his stay.  Tonight had another episode of hypoxia, where he began to brady down and then had cardiac arrest.      Pronounced dead by Russell Fernandez, Russell Fernandez on 2015-05-20 at 01:19 after 1 full minute of auscultation revealed absent heart and lung sounds, absent corneal and pupillary reflexes, and absent response to painful stimuli.                  Cause of death: Pneumonia   Russell Fernandez 2015-05-20, 1:28 AM  [facility] [place] [facility]    OFFICE [phone]  Total clinical and documentation time for today: <30 minutes

## 2015-10-11 NOTE — Progress Notes (Signed)
BP's trending down over past few hours.  Breathing pattern shallow.  E-link MD notified.  New orders entered and followed.

## 2015-10-11 NOTE — Discharge Summary (Signed)
    Death Note please see Last Note for all details.   In breif -81 year old male patient with dementia and diabetes hypertension Ativan replacement with mechanical aortic valve brought in by the family because of worsening mental status, progressive weakness. Found to have bilateral pneumonia, congestive heart failure, atrial fibrillation with RVR. Admitted to intensive care unit started on Cardizem drip. And he also received IV antibiotics for pneumonia. Continued on oxygen. Patient became very agitated and trying to pull off the line so we consulted the psychiatrically Dr. Guss Bundehalla started the patient on Haldol, Ativan. . Patient also was seen by cardiology for atrial fibrillation with RVR: Patient condition deteriorated despite aggressive treatment, patient condition deteriorated and became hypotensive started on normal saline bolus, phenylephrine infusion was started. Condition deteriorated and then became bradycardic and then had a cardiac arrest. And the prior patient was pronounced dead at  1:19 AM on November 30. Family was  contacted by the ICU staff.    Russell Fernandez CSN:[id number],MRN:[id number] is a 81 y.o. male, Outpatient Primary MD for the patient is Russell MascotLemont Morrisey, MD  Pronounced dead by 1:19 AM on the 30th 2016                     Cause of death; hypoxic respiratory failure with  pneumonia Atrial fibrillation with RVR Advance her dementia Mechanical aortic valve Total clinical and documentation time for today Under 30 minutes   Last Note;refer to my note on 11/29

## 2015-10-11 DEATH — deceased

## 2015-10-16 LAB — BLOOD GAS, ARTERIAL
ACID-BASE DEFICIT: 8.6 mmol/L — AB (ref 0.0–2.0)
Bicarbonate: 17.4 mEq/L — ABNORMAL LOW (ref 21.0–28.0)
FIO2: 0.44
O2 Saturation: 91.3 %
PATIENT TEMPERATURE: 37
PH ART: 7.28 — AB (ref 7.350–7.450)
pCO2 arterial: 37 mmHg (ref 32.0–48.0)
pO2, Arterial: 70 mmHg — ABNORMAL LOW (ref 83.0–108.0)

## 2017-04-06 IMAGING — CR DG CHEST 1V
1 series · 1 of 1 positions shown · non-contrast
Comparison: 10/04/2015

CLINICAL DATA: Shortness of breath.

EXAM:
CHEST 1 VIEW

[portable]
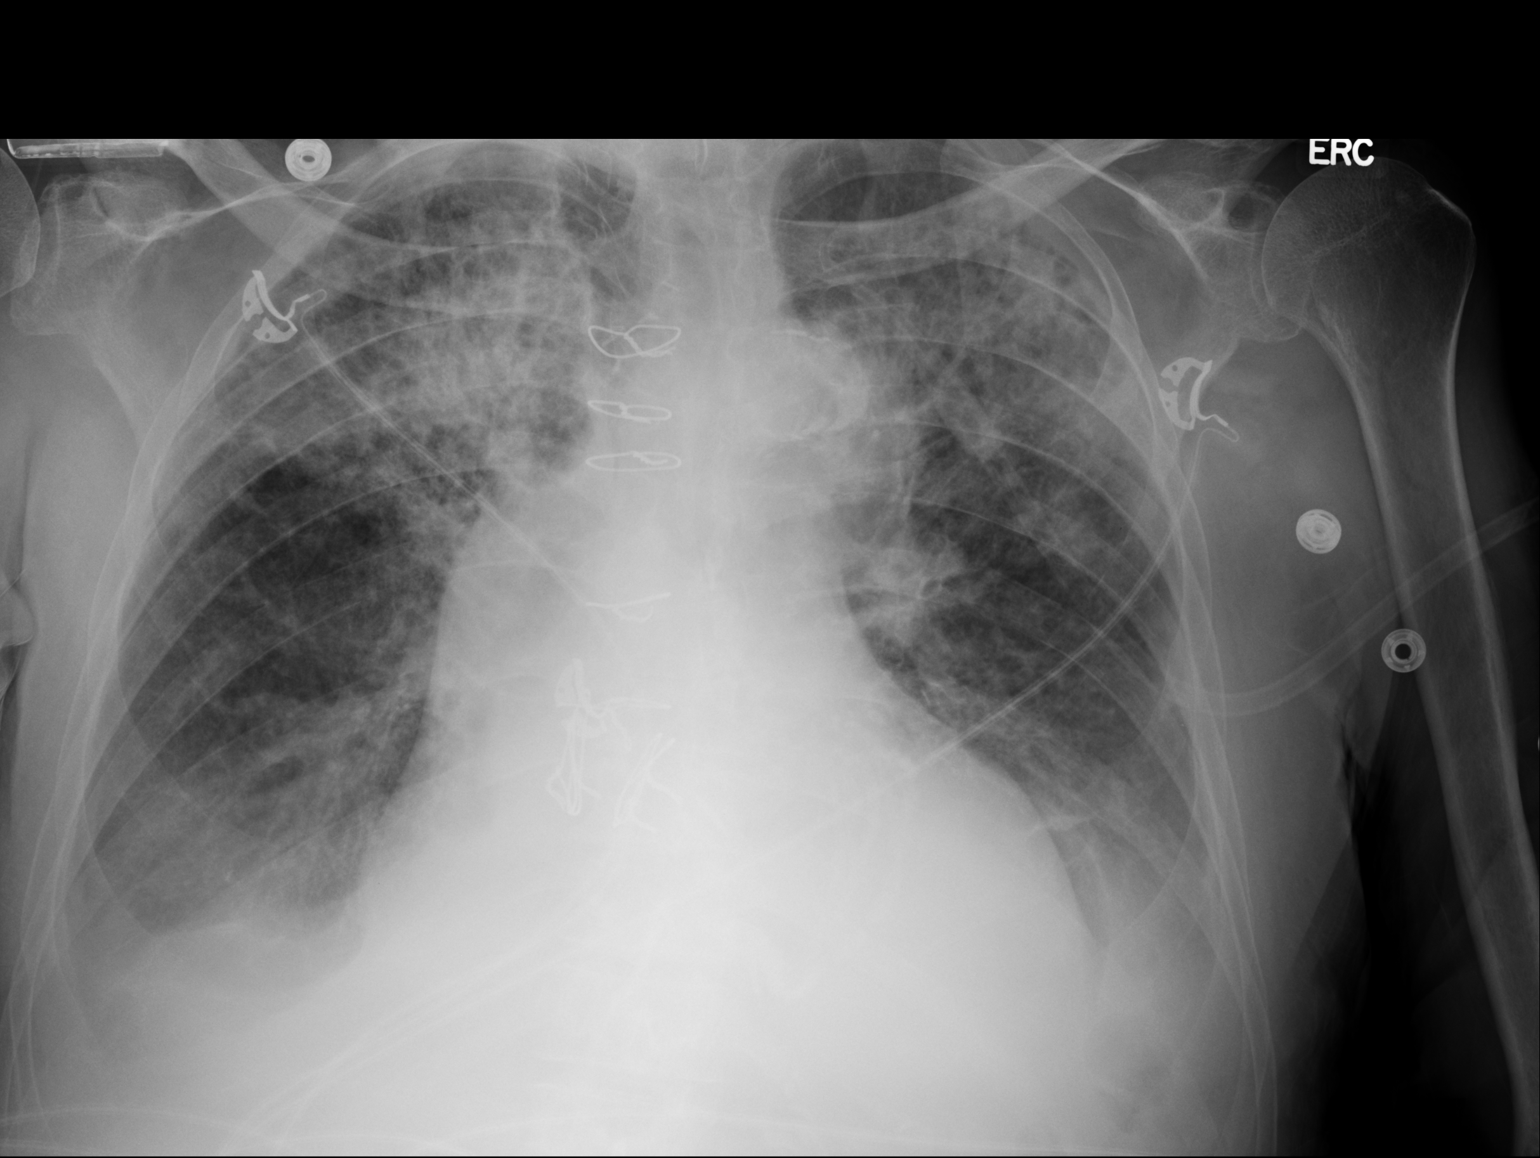

[1 of 1 positions shown; findings below may reference images not displayed]

FINDINGS: Postoperative changes in the mediastinum. Cardiac enlargement with
mild vascular congestion. Bilateral upper lobe infiltrates are
increasing since previous study. Small bilateral pleural effusions
with basilar atelectasis, also increasing. Calcified and tortuous
aorta.
IMPRESSION: Increasing bilateral upper lobe infiltrates, increasing bilateral
pleural effusions, and increasing atelectasis in the lung bases.
Cardiac enlargement with mild pulmonary vascular congestion.
